# Patient Record
Sex: Male | Born: 1970 | Race: White | Hispanic: No | Marital: Married | Smoking: Never smoker
Health system: Southern US, Community
[De-identification: ages and names within clinical notes are randomized; demographics above are authoritative.]

## PROBLEM LIST (undated history)

## (undated) DIAGNOSIS — M1711 Unilateral primary osteoarthritis, right knee: Secondary | ICD-10-CM

## (undated) DIAGNOSIS — D179 Benign lipomatous neoplasm, unspecified: Secondary | ICD-10-CM

## (undated) DIAGNOSIS — E785 Hyperlipidemia, unspecified: Secondary | ICD-10-CM

## (undated) HISTORY — DX: Hyperlipidemia, unspecified: E78.5

## (undated) HISTORY — DX: Unilateral primary osteoarthritis, right knee: M17.11

## (undated) HISTORY — PX: KNEE BURSECTOMY: SHX5882

## (undated) HISTORY — PX: KNEE ARTHROSCOPY W/ ACL RECONSTRUCTION: SHX1858

## (undated) HISTORY — DX: Benign lipomatous neoplasm, unspecified: D17.9

## (undated) HISTORY — PX: APPENDECTOMY: SHX54

---

## 2002-08-17 ENCOUNTER — Ambulatory Visit (HOSPITAL_BASED_OUTPATIENT_CLINIC_OR_DEPARTMENT_OTHER): Admission: RE | Admit: 2002-08-17 | Discharge: 2002-08-18 | Payer: Self-pay | Admitting: Orthopedic Surgery

## 2003-11-08 ENCOUNTER — Ambulatory Visit (HOSPITAL_BASED_OUTPATIENT_CLINIC_OR_DEPARTMENT_OTHER): Admission: RE | Admit: 2003-11-08 | Discharge: 2003-11-08 | Payer: Self-pay | Admitting: Orthopedic Surgery

## 2013-09-13 ENCOUNTER — Ambulatory Visit (INDEPENDENT_AMBULATORY_CARE_PROVIDER_SITE_OTHER): Payer: BC Managed Care – PPO | Admitting: Surgery

## 2013-09-27 ENCOUNTER — Ambulatory Visit (INDEPENDENT_AMBULATORY_CARE_PROVIDER_SITE_OTHER): Payer: BC Managed Care – PPO | Admitting: Surgery

## 2013-09-27 ENCOUNTER — Encounter (INDEPENDENT_AMBULATORY_CARE_PROVIDER_SITE_OTHER): Payer: Self-pay | Admitting: Surgery

## 2013-09-27 VITALS — BP 108/76 | HR 56 | Temp 98.2°F | Resp 14 | Ht 70.0 in | Wt 188.4 lb

## 2013-09-27 DIAGNOSIS — K429 Umbilical hernia without obstruction or gangrene: Secondary | ICD-10-CM

## 2013-09-27 DIAGNOSIS — R222 Localized swelling, mass and lump, trunk: Secondary | ICD-10-CM

## 2013-09-27 DIAGNOSIS — R229 Localized swelling, mass and lump, unspecified: Secondary | ICD-10-CM

## 2013-09-27 DIAGNOSIS — D171 Benign lipomatous neoplasm of skin and subcutaneous tissue of trunk: Secondary | ICD-10-CM | POA: Insufficient documentation

## 2013-09-27 NOTE — Patient Instructions (Addendum)
You have a mass on your back - most likely a lipoma  Please obtain the MRI report from [facility]  See the Handout(s) we gave you.  Consider surgery.  Please call our office at [phone] if you wish to schedule surgery or if you have further questions / concerns.    Lipoma A lipoma is a noncancerous (benign) tumor composed of fat cells. They are usually found under the skin (subcutaneous). A lipoma may occur in any tissue of the body that contains fat. Common areas for lipomas to appear include the back, shoulders, buttocks, and thighs. Lipomas are a very common soft tissue growth. They are soft and grow slowly. Most problems caused by a lipoma depend on where it is growing. DIAGNOSIS  A lipoma can be diagnosed with a physical exam. These tumors rarely become cancerous, but radiographic studies can help determine this for certain. Studies used may include:  Computerized X-ray scans (CT or CAT scan).  Computerized magnetic scans (MRI). TREATMENT  Small lipomas that are not causing problems may be watched. If a lipoma continues to enlarge or causes problems, removal is often the best treatment. Lipomas can also be removed to improve appearance. Surgery is done to remove the fatty cells and the surrounding capsule. Most often, this is done with medicine that numbs the area (local anesthetic). The removed tissue is examined under a microscope to make sure it is not cancerous. Keep all follow-up appointments with your caregiver. SEEK MEDICAL CARE IF:   The lipoma becomes larger or hard.  The lipoma becomes painful, red, or increasingly swollen. These could be signs of infection or a more serious condition. Document Released: 12/04/2002 Document Revised: 03/07/2012 Document Reviewed: 05/16/2010 [facility] Patient Information 2014 [place], [place].

## 2013-09-27 NOTE — Progress Notes (Signed)
Subjective:     Patient ID: Curtis Lewis, male   DOB: 23-Feb-1971, 42 y.o.   MRN: [id number]  HPI  Cort Dragoo  13-May-1971 [id number]  Patient Care Team: Elvera Lennox as PCP - General (Family Medicine)  This patient is a 42 y.o.male who presents today for surgical evaluation at the request of Elvera Lennox, PA-C.   Reason for visit: Enlarging mass on back.  Pleasant active male.  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  Has had a lump in the middle of his back for several years.  Has slowly gotten larger.  Occasionally bothers him by the end of the day when he is active.  His wife has been concerned.  He recalls getting an MRI two years ago that showed lipoma only.  This was done a tried imaging.  Because it getting larger, he was concerned.  His primary care office agree.  Therefore, the patient was sent to me for evaluation.  No history of fall or trauma.  No history of infection.  History of neurofibromatosis or prior lipomas.  Patient Active Problem List   Diagnosis Date Noted  . Mass on back 09/27/2013    Past Medical History  Diagnosis Date  . Hyperlipidemia   . 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 Osteoarthritis of right knee     Past Surgical History  Procedure Laterality Date  . Appendectomy    . Knee arthroscopy w/ acl reconstruction      x3 on right  . Knee bursectomy      left    History   Social History  . Marital Status: Married    Spouse Name: N/A    Number of Children: N/A  . Years of Education: N/A   Occupational History  . Not on file.   Social History Main Topics  . Smoking status: Never Smoker   . Smokeless tobacco: Not on file  . Alcohol Use: 1.2 oz/week    2 Glasses of wine per week  . Drug Use: No  . Sexual Activity: Not on file   Other Topics Concern  . Not on file   Social History Narrative  . No narrative on file    Family History  Problem Relation Age of Onset  . Cancer Maternal Grandmother     lung    Current Outpatient  Prescriptions  Medication Sig Dispense Refill  . atorvastatin (LIPITOR) 20 MG tablet daily.       No current facility-administered medications for this visit.     Allergies  Allergen Reactions  . Percocet [Oxycodone-Acetaminophen] Itching and Nausea And Vomiting    BP 108/76  Pulse 56  Temp(Src) 98.2 F (36.8 C) (Temporal)  Resp 14  Ht 5\' 10"  (1.778 m)  Wt 188 lb 6.4 oz (85.458 kg)  BMI 27.03 kg/m2  No results found.   Review of Systems  Constitutional: Negative for fever, chills and diaphoresis.  HENT: Negative for nosebleeds, sore throat, facial swelling, mouth sores, trouble swallowing and ear discharge.   Eyes: Negative for photophobia, discharge and visual disturbance.  Respiratory: Negative for choking, chest tightness, shortness of breath and stridor.   Cardiovascular: Negative for chest pain and palpitations.  Gastrointestinal: Negative for nausea, vomiting, abdominal pain, diarrhea, constipation, blood in stool, abdominal distention, anal bleeding and rectal pain.  Endocrine: Negative for cold intolerance and heat intolerance.  Genitourinary: Negative for dysuria, urgency, difficulty urinating and testicular pain.  Musculoskeletal: Negative for myalgias, back pain, arthralgias and gait  problem.  Skin: Negative for color change, pallor, rash and wound.  Allergic/Immunologic: Negative for environmental allergies and food allergies.  Neurological: Negative for dizziness, speech difficulty, weakness, numbness and headaches.  Hematological: Negative for adenopathy. Does not bruise/bleed easily.  Psychiatric/Behavioral: Negative for hallucinations, confusion and agitation.       Objective:   Physical Exam  Constitutional: He is oriented to person, place, and time. He appears well-developed and well-nourished. No distress.  HENT:  Head: Normocephalic.  Mouth/Throat: Oropharynx is clear and moist. No oropharyngeal exudate.  Eyes: Conjunctivae and EOM are normal.  Pupils are equal, round, and reactive to light. No scleral icterus.  Neck: Normal range of motion. Neck supple. No tracheal deviation present.  Cardiovascular: Normal rate, regular rhythm and intact distal pulses.   Pulmonary/Chest: Effort normal and breath sounds normal. No respiratory distress.  Abdominal: Soft. He exhibits no distension. There is no tenderness. A hernia is present. Hernia confirmed positive in the ventral area. Hernia confirmed negative in the right inguinal area and confirmed negative in the left inguinal area.    Musculoskeletal: Normal range of motion. He exhibits no tenderness.  Lymphadenopathy:    He has no cervical adenopathy.       Right: No inguinal adenopathy present.       Left: No inguinal adenopathy present.  Neurological: He is alert and oriented to person, place, and time. No cranial nerve deficit. He exhibits normal muscle tone. Coordination normal.  Skin: Skin is warm and dry. No rash noted. He is not diaphoretic. No erythema. No pallor.  Psychiatric: He has a normal mood and affect. His behavior is normal. Judgment and thought content normal.       Assessment:     Slowly enlarging mass on back.  MRI consistent with lipoma.   Very small and asymptomatic hernia    Plan:     Because it is getting larger and occasionally gets symptomatic, it is reasonable to remove it.  I think it is in the deep subcutaneous layer, therefore I would recommend excision under IV sedation at general anesthesia.  Hopefully it is small enough not to require a drain, but that is a possibility:  The pathophysiology of skin & subcutaneous masses was discussed.  Natural history risks without surgery were discussed.  I recommended surgery to remove the mass.  I explained the technique of removal with use of local anesthesia & possible need for more aggressive sedation/anesthesia for patient comfort.    Risks such as bleeding, infection, heart attack, death, and other risks were  discussed.  I noted a good likelihood this will help address the problem.   Possibility that this will not correct all symptoms was explained. Possibility of regrowth/recurrence of the mass was discussed.  We will work to minimize complications. Questions were answered.  The patient expresses understanding & wishes to proceed with surgery.   Small umbilical hernia asymptomatic.  Would monitor only at this time. If it becomes larger or symptomatic, could repair with suture only

## 2013-10-04 ENCOUNTER — Encounter (INDEPENDENT_AMBULATORY_CARE_PROVIDER_SITE_OTHER): Payer: Self-pay

## 2013-10-16 ENCOUNTER — Encounter (INDEPENDENT_AMBULATORY_CARE_PROVIDER_SITE_OTHER): Payer: Self-pay

## 2014-01-26 ENCOUNTER — Ambulatory Visit (INDEPENDENT_AMBULATORY_CARE_PROVIDER_SITE_OTHER): Payer: BC Managed Care – PPO | Admitting: Surgery

## 2014-01-26 ENCOUNTER — Encounter (INDEPENDENT_AMBULATORY_CARE_PROVIDER_SITE_OTHER): Payer: Self-pay | Admitting: Surgery

## 2014-01-26 VITALS — BP 128/80 | HR 71 | Temp 98.8°F | Resp 18 | Ht 70.0 in | Wt 189.8 lb

## 2014-01-26 DIAGNOSIS — R222 Localized swelling, mass and lump, trunk: Secondary | ICD-10-CM

## 2014-01-26 DIAGNOSIS — R229 Localized swelling, mass and lump, unspecified: Secondary | ICD-10-CM

## 2014-01-26 DIAGNOSIS — K429 Umbilical hernia without obstruction or gangrene: Secondary | ICD-10-CM

## 2014-01-26 NOTE — Progress Notes (Signed)
Subjective:     Patient ID: Curtis Lewis, male   DOB: December 27, 1971, 43 y.o.   MRN: [id number]  HPI   Curtis Lewis  1971/02/02 [id number]  Patient Care Team: Baruch Goldmann as PCP - General (Family Medicine)  This patient is a 43 y.o.male who presents today for surgical evaluation at the request of Baruch Goldmann, PA-C.   Reason for visit: Enlarging mass on back.  Pleasant active male.  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  Has had a lump in the middle of his back for several years.  Has slowly gotten larger.  Occasionally bothers him by the end of the day when he is active.  His wife has been concerned.  He recalls getting an MRI a few years ago that showed lipoma only.  This was done a [facility].  Because it getting larger, he was concerned.  His primary care office agreed.  Therefore, the patient was sent to me for evaluation.  No history of fall or trauma.  No history of infection.  History of neurofibromatosis or prior lipomas.  I saw him 3 months ago.  I recommended surgery.  He claims he called & never heard back.  He was told to come back to the office.  Patient Active Problem List   Diagnosis Date Noted  . Mass on back 09/27/2013  . Umbilical hernia - 30mm asymptomatic 09/27/2013    Past Medical History  Diagnosis Date  . Hyperlipidemia   . 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 Osteoarthritis of right knee     Past Surgical History  Procedure Laterality Date  . Appendectomy    . Knee arthroscopy w/ acl reconstruction      x3 on right  . Knee bursectomy      left    History   Social History  . Marital Status: Married    Spouse Name: N/A    Number of Children: N/A  . Years of Education: N/A   Occupational History  . Not on file.   Social History Main Topics  . Smoking status: Never Smoker   . Smokeless tobacco: Not on file  . Alcohol Use: 1.2 oz/week    2 Glasses of wine per week  . Drug Use: No  . Sexual Activity: Not on file   Other Topics Concern  . Not on  file   Social History Narrative  . No narrative on file    Family History  Problem Relation Age of Onset  . Cancer Maternal Grandmother     lung    Current Outpatient Prescriptions  Medication Sig Dispense Refill  . atorvastatin (LIPITOR) 20 MG tablet daily.       No current facility-administered medications for this visit.     Allergies  Allergen Reactions  . Percocet [Oxycodone-Acetaminophen] Itching and Nausea And Vomiting    BP 128/80  Pulse 71  Temp(Src) 98.8 F (37.1 C) (Temporal)  Resp 18  Ht 5\' 10"  (1.778 m)  Wt 189 lb 12.8 oz (86.093 kg)  BMI 27.23 kg/m2  No results found.   Review of Systems  Constitutional: Negative for fever, chills and diaphoresis.  HENT: Negative for ear discharge, facial swelling, mouth sores, nosebleeds, sore throat and trouble swallowing.   Eyes: Negative for photophobia, discharge and visual disturbance.  Respiratory: Negative for choking, chest tightness, shortness of breath and stridor.   Cardiovascular: Negative for chest pain and palpitations.  Gastrointestinal: Negative for nausea, vomiting, abdominal pain, diarrhea, constipation,  blood in stool, abdominal distention, anal bleeding and rectal pain.  Endocrine: Negative for cold intolerance and heat intolerance.  Genitourinary: Negative for dysuria, urgency, difficulty urinating and testicular pain.  Musculoskeletal: Negative for arthralgias, back pain, gait problem and myalgias.  Skin: Negative for color change, pallor, rash and wound.  Allergic/Immunologic: Negative for environmental allergies and food allergies.  Neurological: Negative for dizziness, speech difficulty, weakness, numbness and headaches.  Hematological: Negative for adenopathy. Does not bruise/bleed easily.  Psychiatric/Behavioral: Negative for hallucinations, confusion and agitation.       Objective:   Physical Exam  Constitutional: He is oriented to person, place, and time. He appears well-developed and  well-nourished. No distress.  HENT:  Head: Normocephalic.  Mouth/Throat: Oropharynx is clear and moist. No oropharyngeal exudate.  Eyes: Conjunctivae and EOM are normal. Pupils are equal, round, and reactive to light. No scleral icterus.  Neck: Normal range of motion. Neck supple. No tracheal deviation present.  Cardiovascular: Normal rate, regular rhythm and intact distal pulses.   Pulmonary/Chest: Effort normal and breath sounds normal. No respiratory distress.  Abdominal: Soft. He exhibits no distension. There is no tenderness. A hernia is present. Hernia confirmed positive in the ventral area. Hernia confirmed negative in the right inguinal area and confirmed negative in the left inguinal area.    Musculoskeletal: Normal range of motion. He exhibits no tenderness.       Arms: Lymphadenopathy:    He has no cervical adenopathy.       Right: No inguinal adenopathy present.       Left: No inguinal adenopathy present.  Neurological: He is alert and oriented to person, place, and time. No cranial nerve deficit. He exhibits normal muscle tone. Coordination normal.  Skin: Skin is warm and dry. No rash noted. He is not diaphoretic. No erythema. No pallor.  Psychiatric: He has a normal mood and affect. His behavior is normal. Judgment and thought content normal.   MRI 10/17/2008 from [facility] reveals 4.5 cm right para-midline lower back mass consistent with lipoma.  I reviewed the films     Assessment:     Slowly enlarging mass on back.  MRI consistent with lipoma.   Very small and asymptomatic hernia    Plan:     Because it is getting larger and occasionally gets symptomatic, it is reasonable to remove it.  I think it is in the deep subcutaneous layer, therefore I would recommend excision under IV sedation at general anesthesia.  Hopefully it is small enough not to require a drain, but that is a possibility:  The pathophysiology of skin & subcutaneous masses was discussed.  Natural  history risks without surgery were discussed.  I recommended surgery to remove the mass.  I explained the technique of removal with use of local anesthesia & possible need for more aggressive sedation/anesthesia for patient comfort.    Risks such as bleeding, infection, heart attack, death, and other risks were discussed.  I noted a good likelihood this will help address the problem.   Possibility that this will not correct all symptoms was explained. Possibility of regrowth/recurrence of the mass was discussed.  We will work to minimize complications. Questions were answered.  The patient expresses understanding & wishes to proceed with surgery.  Small umbilical hernia asymptomatic.  Would monitor only at this time. If it becomes larger or symptomatic, could repair with suture only.  He has had no new symptoms so I think observation is okay.     I apologize  that scheduling have been done yet.  I will give him the benefit of the doubt that there was an issue on our side.   I discussed with scheduling.  They gave me a direct order to tell him to call for help get scheduling done.  No charge on visit.  He expressed appreciation for her due diligence to correct the issue.

## 2014-01-26 NOTE — Patient Instructions (Signed)
Please consider the recommendations that we have given you today:  Consider surgery drew the mass on her back menses lipoma).    You have a very small umbilical hernia at your bellybutton.  If it gets larger or bothers you, consider surgery.  Otherwise hold off.  Please call our office at [phone] & talk to Jessy Oto if you wish to schedule surgery or if you have further questions / concerns.   Lipoma A lipoma is a noncancerous (benign) tumor composed of fat cells. They are usually found under the skin (subcutaneous). A lipoma may occur in any tissue of the body that contains fat. Common areas for lipomas to appear include the back, shoulders, buttocks, and thighs. Lipomas are a very common soft tissue growth. They are soft and grow slowly. Most problems caused by a lipoma depend on where it is growing. DIAGNOSIS  A lipoma can be diagnosed with a physical exam. These tumors rarely become cancerous, but radiographic studies can help determine this for certain. Studies used may include:  Computerized X-ray scans (CT or CAT scan).  Computerized magnetic scans (MRI). TREATMENT  Small lipomas that are not causing problems may be watched. If a lipoma continues to enlarge or causes problems, removal is often the best treatment. Lipomas can also be removed to improve appearance. Surgery is done to remove the fatty cells and the surrounding capsule. Most often, this is done with medicine that numbs the area (local anesthetic). The removed tissue is examined under a microscope to make sure it is not cancerous. Keep all follow-up appointments with your caregiver. SEEK MEDICAL CARE IF:   The lipoma becomes larger or hard.  The lipoma becomes painful, red, or increasingly swollen. These could be signs of infection or a more serious condition. Document Released: 12/04/2002 Document Revised: 03/07/2012 Document Reviewed: 05/16/2010 [facility] Patient Information 2014 [place], [place].  GENERAL  SURGERY: POST OP INSTRUCTIONS  1. DIET: Follow a light bland diet the first 24 hours after arrival home, such as soup, liquids, crackers, etc.  Be sure to include lots of fluids daily.  Avoid fast food or heavy meals as your are more likely to get nauseated.   2. Take your usually prescribed home medications unless otherwise directed. 3. PAIN CONTROL: a. Pain is best controlled by a usual combination of three different methods TOGETHER: i. Ice/Heat ii. Over the counter pain medication iii. Prescription pain medication b. Most patients will experience some swelling and bruising around the incisions.  Ice packs or heating pads (30-60 minutes up to 6 times a day) will help. Use ice for the first few days to help decrease swelling and bruising, then switch to heat to help relax tight/sore spots and speed recovery.  Some people prefer to use ice alone, heat alone, alternating between ice & heat.  Experiment to what works for you.  Swelling and bruising can take several weeks to resolve.   c. It is helpful to take an over-the-counter pain medication regularly for the first few weeks.  Choose one of the following that works best for you: i. Naproxen (Aleve, etc)  Two 220mg  tabs twice a day ii. Ibuprofen (Advil, etc) Three 200mg  tabs four times a day (every meal & bedtime) iii. Acetaminophen (Tylenol, etc) 500-650mg  four times a day (every meal & bedtime) d. A  prescription for pain medication (such as oxycodone, hydrocodone, etc) should be given to you upon discharge.  Take your pain medication as prescribed.  i. If you are having problems/concerns with  the prescription medicine (does not control pain, nausea, vomiting, rash, itching, etc), please call us [phone] to see if we need to switch you to a different pain medicine that will work better for you and/or control your side effect better. ii. If you need a refill on your pain medication, please contact your pharmacy.  They will contact our office  to request authorization. Prescriptions will not be filled after 5 pm or on week-ends. 4. Avoid getting constipated.  Between the surgery and the pain medications, it is common to experience some constipation.  Increasing fluid intake and taking a fiber supplement (such as Metamucil, Citrucel, FiberCon, MiraLax, etc) 1-2 times a day regularly will usually help prevent this problem from occurring.  A mild laxative (prune juice, Milk of Magnesia, MiraLax, etc) should be taken according to package directions if there are no bowel movements after 48 hours.   5. Wash / shower every day.  You may shower over the dressings as they are waterproof.  Continue to shower over incision(s) after the dressing is off. 6. Remove your waterproof bandages 5 days after surgery.  You may leave the incision open to air.  You may have skin tapes (Steri Strips) covering the incision(s).  Leave them on until one week, then remove.  You may replace a dressing/Band-Aid to cover the incision for comfort if you wish.      7. ACTIVITIES as tolerated:   a. You may resume regular (light) daily activities beginning the next day-such as daily self-care, walking, climbing stairs-gradually increasing activities as tolerated.  If you can walk 30 minutes without difficulty, it is safe to try more intense activity such as jogging, treadmill, bicycling, low-impact aerobics, swimming, etc. b. Save the most intensive and strenuous activity for last such as sit-ups, heavy lifting, contact sports, etc  Refrain from any heavy lifting or straining until you are off narcotics for pain control.   c. DO NOT PUSH THROUGH PAIN.  Let pain be your guide: If it hurts to do something, don't do it.  Pain is your body warning you to avoid that activity for another week until the pain goes down. d. You may drive when you are no longer taking prescription pain medication, you can comfortably wear a seatbelt, and you can safely maneuver your car and apply  brakes. e. Dennis Bast may have sexual intercourse when it is comfortable.  8. FOLLOW UP in our office a. Please call [facility] at [phone] to set up an appointment to see your surgeon in the office for a follow-up appointment approximately 2-3 weeks after your surgery. b. Make sure that you call for this appointment the day you arrive home to insure a convenient appointment time. 9. IF YOU HAVE DISABILITY OR FAMILY LEAVE FORMS, BRING THEM TO THE OFFICE FOR PROCESSING.  DO NOT GIVE THEM TO YOUR DOCTOR.   WHEN TO CALL us [phone]: 1. Poor pain control 2. Reactions / problems with new medications (rash/itching, nausea, etc)  3. Fever over 101.5 F (38.5 C) 4. Worsening swelling or bruising 5. Continued bleeding from incision. 6. Increased pain, redness, or drainage from the incision 7. Difficulty breathing / swallowing   The clinic staff is available to answer your questions during regular business hours (8:30am-5pm).  Please don't hesitate to call and ask to speak to one of our nurses for clinical concerns.   If you have a medical emergency, go to the nearest emergency room or call 911.  A surgeon from [employer]  [facility] is always on call at the [facility], [address], [place], [place], [place]  [address] ? MAIN: [phone] ? TOLL FREE: [phone] ?  FAX [phone] [email or web address]

## 2014-02-15 ENCOUNTER — Other Ambulatory Visit (INDEPENDENT_AMBULATORY_CARE_PROVIDER_SITE_OTHER): Payer: Self-pay | Admitting: Surgery

## 2014-02-15 DIAGNOSIS — D1739 Benign lipomatous neoplasm of skin and subcutaneous tissue of other sites: Secondary | ICD-10-CM

## 2014-02-16 ENCOUNTER — Telehealth (INDEPENDENT_AMBULATORY_CARE_PROVIDER_SITE_OTHER): Payer: Self-pay

## 2014-02-16 ENCOUNTER — Other Ambulatory Visit (INDEPENDENT_AMBULATORY_CARE_PROVIDER_SITE_OTHER): Payer: Self-pay | Admitting: *Deleted

## 2014-02-16 MED ORDER — HYDROCODONE-ACETAMINOPHEN 5-325 MG PO TABS: 1.0000 | ORAL_TABLET | ORAL | Status: DC | PRN

## 2014-02-16 NOTE — Telephone Encounter (Signed)
The patient has a drain in the subcutaneous tissues of his back s/p removal of lipomas.  The drain will need to be removed in about 10 days once the output is less than 30 mL a day x 2 days.  That can be a nurse only visit

## 2014-02-16 NOTE — Telephone Encounter (Signed)
Called and spoke to patient to make aware that we will make appointment for drain removal once his drainage has been less than 30 mL's for 2 day's or in 10 day's per Dr. Johney [place] order.  Patient verbalized understanding and will call our office to make appointment for drain removal.

## 2014-02-16 NOTE — Telephone Encounter (Signed)
Patient calling into office to schedule nurse visit for drain removal.  Patient s/p back mass excision on 02/15/14.  Patient aware that we will send a message to Dr. Johney [place] for order for drain removal and call back with appointment.

## 2014-02-20 ENCOUNTER — Telehealth (INDEPENDENT_AMBULATORY_CARE_PROVIDER_SITE_OTHER): Payer: Self-pay

## 2014-02-20 NOTE — Telephone Encounter (Signed)
Notified pt of his pathology report showing benign lipomas per Dr Johney [place]. The pt understands.

## 2014-02-27 ENCOUNTER — Ambulatory Visit (INDEPENDENT_AMBULATORY_CARE_PROVIDER_SITE_OTHER): Payer: BC Managed Care – PPO

## 2014-02-27 DIAGNOSIS — Z4889 Encounter for other specified surgical aftercare: Secondary | ICD-10-CM

## 2014-02-27 DIAGNOSIS — Z4803 Encounter for change or removal of drains: Secondary | ICD-10-CM

## 2014-02-27 NOTE — Progress Notes (Signed)
Patient comes in the office 12 days s/p excision of back mass. He states he has been draining less than 20-25 cc a day for 4 days now. Drain removed per Dr Johney [place] orders. Sutures that were holding the drain in were cut and the drain was removed without difficulty. Applied a dry dressing over drain hole. Advised patient to keep covered for the rest of the day. Tomorrow he can remove bandage and shower. Apply bandage to area as needed for drainage. Patient to follow up with Dr Johney [place] at scheduled date and time.

## 2014-03-01 ENCOUNTER — Ambulatory Visit (INDEPENDENT_AMBULATORY_CARE_PROVIDER_SITE_OTHER): Payer: BC Managed Care – PPO | Admitting: Surgery

## 2014-03-01 ENCOUNTER — Encounter (INDEPENDENT_AMBULATORY_CARE_PROVIDER_SITE_OTHER): Payer: Self-pay | Admitting: Surgery

## 2014-03-01 VITALS — BP 122/80 | HR 78 | Temp 99.2°F | Resp 16 | Ht 70.0 in | Wt 194.0 lb

## 2014-03-01 DIAGNOSIS — D171 Benign lipomatous neoplasm of skin and subcutaneous tissue of trunk: Secondary | ICD-10-CM

## 2014-03-01 DIAGNOSIS — D1779 Benign lipomatous neoplasm of other sites: Secondary | ICD-10-CM

## 2014-03-01 NOTE — Patient Instructions (Signed)
GENERAL SURGERY: POST OP INSTRUCTIONS ° °1. DIET: Follow a light bland diet the first 24 hours after arrival home, such as soup, liquids, crackers, etc.  Be sure to include lots of fluids daily.  Avoid fast food or heavy meals as your are more likely to get nauseated.   °2. Take your usually prescribed home medications unless otherwise directed. °3. PAIN CONTROL: °a. Pain is best controlled by a usual combination of three different methods TOGETHER: °i. Ice/Heat °ii. Over the counter pain medication °iii. Prescription pain medication °b. Most patients will experience some swelling and bruising around the incisions.  Ice packs or heating pads (30-60 minutes up to 6 times a day) will help. Use ice for the first few days to help decrease swelling and bruising, then switch to heat to help relax tight/sore spots and speed recovery.  Some people prefer to use ice alone, heat alone, alternating between ice & heat.  Experiment to what works for you.  Swelling and bruising can take several weeks to resolve.   °c. It is helpful to take an over-the-counter pain medication regularly for the first few weeks.  Choose one of the following that works best for you: °i. Naproxen (Aleve, etc)  Two 220mg tabs twice a day °ii. Ibuprofen (Advil, etc) Three 200mg tabs four times a day (every meal & bedtime) °iii. Acetaminophen (Tylenol, etc) 500-650mg four times a day (every meal & bedtime) °d. A  prescription for pain medication (such as oxycodone, hydrocodone, etc) should be given to you upon discharge.  Take your pain medication as prescribed.  °i. If you are having problems/concerns with the prescription medicine (does not control pain, nausea, vomiting, rash, itching, etc), please call us [phone] to see if we need to switch you to a different pain medicine that will work better for you and/or control your side effect better. °ii. If you need a refill on your pain medication, please contact your pharmacy.  They will contact our  office to request authorization. Prescriptions will not be filled after 5 pm or on week-ends. °4. Avoid getting constipated.  Between the surgery and the pain medications, it is common to experience some constipation.  Increasing fluid intake and taking a fiber supplement (such as Metamucil, Citrucel, FiberCon, MiraLax, etc) 1-2 times a day regularly will usually help prevent this problem from occurring.  A mild laxative (prune juice, Milk of Magnesia, MiraLax, etc) should be taken according to package directions if there are no bowel movements after 48 hours.   °5. Wash / shower every day.  You may shower over the dressings as they are waterproof.  Continue to shower over incision(s) after the dressing is off. °6. Remove your waterproof bandages 5 days after surgery.  You may leave the incision open to air.  You may have skin tapes (Steri Strips) covering the incision(s).  Leave them on until one week, then remove.  You may replace a dressing/Band-Aid to cover the incision for comfort if you wish.  ° ° ° ° °7. ACTIVITIES as tolerated:   °a. You may resume regular (light) daily activities beginning the next day--such as daily self-care, walking, climbing stairs--gradually increasing activities as tolerated.  If you can walk 30 minutes without difficulty, it is safe to try more intense activity such as jogging, treadmill, bicycling, low-impact aerobics, swimming, etc. °b. Save the most intensive and strenuous activity for last such as sit-ups, heavy lifting, contact sports, etc  Refrain from any heavy lifting or straining until you   are off narcotics for pain control.   °c. DO NOT PUSH THROUGH PAIN.  Let pain be your guide: If it hurts to do something, don't do it.  Pain is your body warning you to avoid that activity for another week until the pain goes down. °d. You may drive when you are no longer taking prescription pain medication, you can comfortably wear a seatbelt, and you can safely maneuver your car and  apply brakes. °e. You may have sexual intercourse when it is comfortable.  °8. FOLLOW UP in our office °a. Please call [facility] at [phone] to set up an appointment to see your surgeon in the office for a follow-up appointment approximately 2-3 weeks after your surgery. °b. Make sure that you call for this appointment the day you arrive home to insure a convenient appointment time. °9. IF YOU HAVE DISABILITY OR FAMILY LEAVE FORMS, BRING THEM TO THE OFFICE FOR PROCESSING.  DO NOT GIVE THEM TO YOUR DOCTOR. ° ° °WHEN TO CALL US [phone]: °1. Poor pain control °2. Reactions / problems with new medications (rash/itching, nausea, etc)  °3. Fever over 101.5 F (38.5 C) °4. Worsening swelling or bruising °5. Continued bleeding from incision. °6. Increased pain, redness, or drainage from the incision °7. Difficulty breathing / swallowing ° ° The clinic staff is available to answer your questions during regular business hours (8:30am-5pm).  Please don’t hesitate to call and ask to speak to one of our nurses for clinical concerns.  ° If you have a medical emergency, go to the nearest emergency room or call 911. ° A surgeon from [facility] is always on call at the hospitals ° ° °[facility] °[address], [address], [place], [place]  [address] ? °MAIN: [phone] ? TOLL FREE: [phone] ?  °FAX [phone] °[email or web address] ° °

## 2014-03-02 NOTE — Progress Notes (Signed)
Subjective:     Patient ID: Curtis Lewis, male   DOB: [birth year], 43 y.o.   MRN: [id number]  HPI  Note: This dictation was prepared with Dragon/digital dictation along with [employer]. Any transcriptional errors that result from this process are unintentional.       Hoy Fallert  1971/03/06 [id number]  Patient Care Team: Baruch Goldmann as PCP - General (Family Medicine)  Procedure (Date: 02/15/2014):  Removal of subcutaneous and intramuscular back lipomas  Diagnosis Soft tissue, biopsy, mass, back - MATURE ADIPOSE, CONSISTENT WITH LIPOMA. Vicente Males MD Pathologist, Electronic Signature  This patient returns for surgical re-evaluation.  He feels fine.  Drain came out a few days ago.  He is back to work.  No fevers or chills.  No pain.  In good spirits.  Patient Active Problem List   Diagnosis Date Noted  . Lipoma of back s/p excision 02/15/2014 09/27/2013  . Umbilical hernia - 29mm asymptomatic 09/27/2013    Past Medical History  Diagnosis Date  . Hyperlipidemia   . 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 Osteoarthritis of right knee     Past Surgical History  Procedure Laterality Date  . Appendectomy    . Knee arthroscopy w/ acl reconstruction      x3 on right  . Knee bursectomy      left    History   Social History  . Marital Status: Married    Spouse Name: N/A    Number of Children: N/A  . Years of Education: N/A   Occupational History  . Not on file.   Social History Main Topics  . Smoking status: Never Smoker   . Smokeless tobacco: Not on file  . Alcohol Use: 1.2 oz/week    2 Glasses of wine per week  . Drug Use: No  . Sexual Activity: Not on file   Other Topics Concern  . Not on file   Social History Narrative  . No narrative on file    Family History  Problem Relation Age of Onset  . Cancer Maternal Grandmother     lung    Current Outpatient Prescriptions  Medication Sig Dispense Refill  . atorvastatin (LIPITOR) 20 MG tablet  daily.       No current facility-administered medications for this visit.     Allergies  Allergen Reactions  . Percocet [Oxycodone-Acetaminophen] Itching and Nausea And Vomiting    BP 122/80  Pulse 78  Temp(Src) 99.2 F (37.3 C) (Oral)  Resp 16  Ht 5\' 10"  (1.778 m)  Wt 194 lb (87.998 kg)  BMI 27.84 kg/m2  No results found.   Review of Systems  Constitutional: Negative for fever, chills and diaphoresis.  HENT: Negative for sore throat and trouble swallowing.   Eyes: Negative for photophobia and visual disturbance.  Respiratory: Negative for choking and shortness of breath.   Cardiovascular: Negative for chest pain and palpitations.  Gastrointestinal: Negative for nausea, vomiting, abdominal distention, anal bleeding and rectal pain.  Genitourinary: Negative for dysuria, urgency, difficulty urinating and testicular pain.  Musculoskeletal: Negative for arthralgias, back pain, gait problem, myalgias and neck pain.  Skin: Negative for color change and rash.  Neurological: Negative for dizziness, speech difficulty, weakness and numbness.  Hematological: Negative for adenopathy.  Psychiatric/Behavioral: Negative for hallucinations, confusion and agitation.       Objective:   Physical Exam  Constitutional: He is oriented to person, place, and time. He appears well-developed and well-nourished. No distress.  HENT:  Head: Normocephalic.  Mouth/Throat: Oropharynx is clear and moist. No oropharyngeal exudate.  Eyes: Conjunctivae and EOM are normal. Pupils are equal, round, and reactive to light. No scleral icterus.  Neck: Normal range of motion. No tracheal deviation present.  Cardiovascular: Normal rate, normal heart sounds and intact distal pulses.   Pulmonary/Chest: Effort normal. No respiratory distress.  Abdominal: Soft. He exhibits no distension. There is no tenderness. Hernia confirmed negative in the right inguinal area and confirmed negative in the left inguinal area.    Musculoskeletal: Normal range of motion. He exhibits no tenderness.       Back:  Neurological: He is alert and oriented to person, place, and time. No cranial nerve deficit. He exhibits normal muscle tone. Coordination normal.  Skin: Skin is warm and dry. No rash noted. He is not diaphoretic.  Psychiatric: He has a normal mood and affect. His behavior is normal.       Assessment:     Status post removal of packed subcutaneous and intramuscular lipomas, recovering well     Plan:     I am glad he is healing well so far.  He can put antibiotic ointment on the medial corner.  Otherwise covered with a Band-Aid.  If he has worsening pain swelling or drainage, return to clinic.  I think that is unlikely.  Increase activity as tolerated to regular activity.  Low impact exercise such as walking an hour a day at least ideal.  Do not push through pain.  Diet as tolerated.  Low fat high fiber diet ideal.  Bowel regimen with 30 g fiber a day and fiber supplement as needed to avoid problems.  Return to clinic as needed.   Instructions discussed.  Followup with primary care physician for other health issues as would normally be done.  Consider screening for malignancies (breast, prostate, colon, melanoma, etc) as appropriate.  Questions answered.  The patient expressed understanding and appreciation

## 2014-03-30 ENCOUNTER — Telehealth (INDEPENDENT_AMBULATORY_CARE_PROVIDER_SITE_OTHER): Payer: Self-pay | Admitting: General Surgery

## 2014-03-30 NOTE — Telephone Encounter (Signed)
S/P lipoma excision in Feb.  Pt states wound is draining fluid and this started 2 days ago.  States white drainage from incision as well.  Told patient to have this evaluated in ED or urgent clinic today.  Pt expressed understanding.

## 2014-04-02 ENCOUNTER — Telehealth (INDEPENDENT_AMBULATORY_CARE_PROVIDER_SITE_OTHER): Payer: Self-pay

## 2014-04-02 NOTE — Telephone Encounter (Signed)
[place] know what he should do about the draining wd on his back. Pt states it is clear, urine color. No fever. No redness. Pt advised to cover with dry dsg and he can to to an urgent clinic there if area becomes red, feverish, swollen or fluid changes to appear infected. Pt prefers to come in [place] to have our MD ck wd if drainage continues. Pt advised to avoid pools, ocean or public water and to keep area clean, covered with dry dsg. P states he understands.

## 2014-04-04 ENCOUNTER — Ambulatory Visit (INDEPENDENT_AMBULATORY_CARE_PROVIDER_SITE_OTHER): Payer: BC Managed Care – PPO | Admitting: General Surgery

## 2014-04-04 ENCOUNTER — Encounter (INDEPENDENT_AMBULATORY_CARE_PROVIDER_SITE_OTHER): Payer: Self-pay | Admitting: General Surgery

## 2014-04-04 VITALS — BP 120/80 | HR 72 | Resp 14 | Ht 70.0 in | Wt 192.4 lb

## 2014-04-04 DIAGNOSIS — IMO0002 Reserved for concepts with insufficient information to code with codable children: Secondary | ICD-10-CM

## 2014-04-04 NOTE — Progress Notes (Signed)
Chief complaint: Wound drainage  History: Patient underwent excision of large lipoma from his back about one month ago. He thought he was doing fine but a couple days ago to beat she developed spontaneous drainage of a large amount of clear yellow fluid. He was seen at an urgent care and was given a prescription for clindamycin and cultures were taken. His continued to have persistent drainage. No pain or redness or fever or pus.  Exam BP 120/80  Pulse 72  Resp 14  Ht 5\' 10"  (1.778 m)  Wt 192 lb 6.4 oz (87.272 kg)  BMI 27.61 kg/m2 General: No distress Wounds: There is an incision across the mid back which is opened for about 1 cm medially with a little bit of necrotic fat at the edges and opens into a fairly large cavity with clear serous sanguinous fluid.  Assessment and plan: Spontaneously drained large wound seroma. I evacuated the cavity completely and packed this with dry gauze. He'll remove this tomorrow and begin dressing changes. Complete his antibiotics although I do not see any definite evidence of infection. Return next week for followup.

## 2014-04-08 NOTE — Telephone Encounter (Signed)
Pt needs to be seen by me this week to check on wound from spontaneously draining seroma (s/p back lipoma excision Feb 2015).  Please fit it in.  OK to extend Wed clinic

## 2014-04-09 NOTE — Telephone Encounter (Signed)
LMOM for pt to call me. I want to check on him after pt was seen in urgent office last week by Dr Excell Seltzer and seen out of town.

## 2014-04-10 NOTE — Telephone Encounter (Signed)
Pt returned my call. I asked how the pt was doing and he is doing better. I offered for the pt to be seen this week by Dr Johney [place] so we can check this seroma but the pt declined the appt due to going out of town. The pt is fine with the appt he made for 04/18/14 with Dr Johney [place]. I advised pt that we just wanted to offer an earlier appt to him since Dr Johney [place] was not in the office last week. The pt appreciates the call.

## 2014-04-12 ENCOUNTER — Encounter (INDEPENDENT_AMBULATORY_CARE_PROVIDER_SITE_OTHER): Payer: BC Managed Care – PPO | Admitting: Surgery

## 2014-04-18 ENCOUNTER — Encounter (INDEPENDENT_AMBULATORY_CARE_PROVIDER_SITE_OTHER): Payer: Self-pay | Admitting: Surgery

## 2014-04-18 ENCOUNTER — Ambulatory Visit (INDEPENDENT_AMBULATORY_CARE_PROVIDER_SITE_OTHER): Payer: BC Managed Care – PPO | Admitting: Surgery

## 2014-04-18 VITALS — BP 110/64 | Resp 18 | Ht 70.0 in | Wt 197.0 lb

## 2014-04-18 DIAGNOSIS — D171 Benign lipomatous neoplasm of skin and subcutaneous tissue of trunk: Secondary | ICD-10-CM

## 2014-04-18 DIAGNOSIS — K429 Umbilical hernia without obstruction or gangrene: Secondary | ICD-10-CM

## 2014-04-18 DIAGNOSIS — IMO0002 Reserved for concepts with insufficient information to code with codable children: Secondary | ICD-10-CM

## 2014-04-18 DIAGNOSIS — D1779 Benign lipomatous neoplasm of other sites: Secondary | ICD-10-CM

## 2014-04-18 NOTE — Patient Instructions (Signed)
WOUND CARE  It is important that the wound be kept open.   -Keeping the skin edges apart will allow the wound to gradually heal from the base upwards.   - If the skin edges of the wound close too early, a new fluid pocket can form and infection can occur. -This is the reason to pack deeper wounds with gauze or ribbon -This is why drained wounds cannot be sewed closed right away  A healthy wound should form a lining of bright red "beefy" granulating tissue that will help shrink the wound and help the edges grow new skin into it.   -A little mucus / yellow discharge is normal (the body's natural way to try and form a scab) and should be gently washed off with soap and water with daily dressing changes.  -Green or foul smelling drainage implies bacterial colonization and can slow wound healing - a short course of antibiotic ointment (3-5 days) can help it clear up.  Call the doctor if it does not improve or worsens  -Avoid use of antibiotic ointments for more than a week as they can slow wound healing over time.    -Sometimes other wound care products will be used to reduce need for dressing changes and/or help clean up dirty wounds -Sometimes the surgeon needs to debride the wound in the office to remove dead or infected tissue out of the wound so it can heal more quickly and safely.    Change the dressing at least once a day -Wash the wound with mild soap and water gently every day.  It is good to shower or bathe the wound to help it clean out. -Use clean 4x4 gauze for medium/large wounds or ribbon plain NU-gauze for smaller wounds (it does not need to be sterile, just clean) -Keep the raw wound moist with a little saline or KY (saline) gel on the gauze.  -A dry wound will take longer to heal.  -Keep the skin dry around the wound to prevent breakdown and irritation. -Pack the wound down to the base -The goal is to keep the skin apart, not overpack the wound -Use a Q-tip or blunt-tipped kabob  stick toothpick to push the gauze down to the base in narrow or deep wounds   -Cover with a clean gauze and tape -paper or Medipore tape tend to be gentle on the skin -rotate the orientation of the tape to avoid repeated stress/trauma on the skin -using an ACE or Coban wrap on wounds on arms or legs can be used instead.  Complete all antibiotics through the entire prescription to help the infection heal and prevent new places of infection   Returning the see the surgeon is helpful to follow the healing process and help the wound close as fast as possible.  Seroma A seroma is a collection of fluid that looks like swelling or a mass on the body. Seromas form on the body where tissue has been injured or cut. They are most common after surgeries. Seromas vary in size. Some are small and painless. Others may become large and cause pain or discomfort. Many seromas go away on their own; the fluid is naturally absorbed by the body. Some may require the fluid to be drained through medical procedures.  CAUSES  Seromas form as the result of damage to tissue or the removal of tissue. This tissue damage may occur during surgery or because of an injury or trauma. When tissue is disrupted or removed, empty space is  created. The body's natural defense system causes fluid to enter the empty space and form a seroma. SYMPTOMS   Swelling at the site of a surgical cut (incision) or an injury.  Drainage of clear fluid at the surgery or injury site.  Possible discomfort or pain. DIAGNOSIS  Your caregiver will perform a physical exam. During the exam, the caregiver will press on the seroma using a hand or fingers (palpation). Various tests may be ordered to help confirm the diagnosis. These tests may include:  Blood tests.  Imaging tests such as ultrasonography or computed tomography (CT). TREATMENT  Sometimes seromas resolve on their own and drain naturally in the body. Your caregiver may monitor you to make  sure the seroma does not cause any complications. If your seroma does not resolve on its own, treatment may include:  Using a needle to drain the fluid from the seroma (needle aspiration).  Inserting a flexible tube (catheter) to drain the fluid.  Applying a dressing, such as an elastic bandage or binder.  Use of antibiotic medicines if the seroma becomes infected.  In rare cases, surgery may be done to remove the seroma and repair the area. HOME CARE INSTRUCTIONS  Follow your caregiver's instructions regarding activity levels and any limitations on movements.  Only take over-the-counter or prescription medicines as directed by your caregiver.  If your caregiver prescribes antibiotics, take them as directed. Finish them even if you start to feel better.  Check your seroma every day for redness, warmth, or yellow drainage.  Follow up with your caregiver as directed. SEEK MEDICAL CARE IF:  You develop a fever.  You have pain, tenderness, redness, or warmth at the site of the seroma.  You notice yellow drainage coming from the site of the seroma.  Your seroma is getting bigger. Document Released: 04/10/2013 Document Reviewed: 04/10/2013 [facility] Patient Information 2014 [place], [place].

## 2014-04-18 NOTE — Progress Notes (Signed)
Subjective:     Patient ID: Curtis Lewis, male   DOB: [birth year], 43 y.o.   MRN: [id number]  HPI   Note: This dictation was prepared with Dragon/digital dictation along with [employer]. Any transcriptional errors that result from this process are unintentional.       Curtis Lewis  October 06, 1971 [id number]  Patient Care Team: Baruch Goldmann as PCP - General (Family Medicine)  Procedure (Date: 02/15/2014):  Removal of subcutaneous and intramuscular back lipomas  Diagnosis Soft tissue, biopsy, mass, back - MATURE ADIPOSE, CONSISTENT WITH LIPOMA. Vicente Males MD Pathologist, Electronic Signature  This patient returns for surgical re-evaluation.  Some swelling.  It spontaneously drained.  He saw [place] urgently.  Wound opened up and packed 04/03/2014.  He let the packing come out.  He did not repack it since no one would help him.  He feels like things are mostly dried off.  Occasionally notes some leak of fluid or gas.  Otherwise staying active.  No fevers or chills.  No pain.  In good spirits.  Patient Active Problem List   Diagnosis Date Noted  . Seroma, postoperative 04/18/2014  . Lipoma of back s/p excision 02/15/2014 09/27/2013  . Umbilical hernia - 26mm asymptomatic 09/27/2013    Past Medical History  Diagnosis Date  . Hyperlipidemia   . 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 Osteoarthritis of right knee     Past Surgical History  Procedure Laterality Date  . Appendectomy    . Knee arthroscopy w/ acl reconstruction      x3 on right  . Knee bursectomy      left    History   Social History  . Marital Status: Married    Spouse Name: N/A    Number of Children: N/A  . Years of Education: N/A   Occupational History  . Not on file.   Social History Main Topics  . Smoking status: Never Smoker   . Smokeless tobacco: Not on file  . Alcohol Use: 1.2 oz/week    2 Glasses of wine per week  . Drug Use: No  . Sexual Activity: Not on file   Other Topics Concern  . Not  on file   Social History Narrative  . No narrative on file    Family History  Problem Relation Age of Onset  . Cancer Maternal Grandmother     lung    Current Outpatient Prescriptions  Medication Sig Dispense Refill  . atorvastatin (LIPITOR) 20 MG tablet daily.      . clindamycin (CLEOCIN) 300 MG capsule Take 300 mg by mouth 3 (three) times daily.       No current facility-administered medications for this visit.     Allergies  Allergen Reactions  . Percocet [Oxycodone-Acetaminophen] Itching and Nausea And Vomiting    BP 110/64  Resp 18  Ht 5\' 10"  (1.778 m)  Wt 197 lb (89.359 kg)  BMI 28.27 kg/m2  No results found.   Review of Systems  Constitutional: Negative for fever, chills and diaphoresis.  HENT: Negative for sore throat and trouble swallowing.   Eyes: Negative for photophobia and visual disturbance.  Respiratory: Negative for choking and shortness of breath.   Cardiovascular: Negative for chest pain and palpitations.  Gastrointestinal: Negative for nausea, vomiting, abdominal distention, anal bleeding and rectal pain.  Genitourinary: Negative for dysuria, urgency, difficulty urinating and testicular pain.  Musculoskeletal: Negative for arthralgias, back pain, gait problem, myalgias and neck pain.  Skin:  Negative for color change and rash.  Neurological: Negative for dizziness, speech difficulty, weakness and numbness.  Hematological: Negative for adenopathy.  Psychiatric/Behavioral: Negative for hallucinations, confusion and agitation.       Objective:   Physical Exam  Constitutional: He is oriented to person, place, and time. He appears well-developed and well-nourished. No distress.  HENT:  Head: Normocephalic.  Mouth/Throat: Oropharynx is clear and moist. No oropharyngeal exudate.  Eyes: Conjunctivae and EOM are normal. Pupils are equal, round, and reactive to light. No scleral icterus.  Neck: Normal range of motion. No tracheal deviation present.    Cardiovascular: Normal rate, normal heart sounds and intact distal pulses.   Pulmonary/Chest: Effort normal. No respiratory distress.  Abdominal: Soft. He exhibits no distension. There is no tenderness. Hernia confirmed negative in the right inguinal area and confirmed negative in the left inguinal area.  Musculoskeletal: Normal range of motion. He exhibits no tenderness.       Back:  Neurological: He is alert and oriented to person, place, and time. No cranial nerve deficit. He exhibits normal muscle tone. Coordination normal.  Skin: Skin is warm and dry. No rash noted. He is not diaphoretic.  Psychiatric: He has a normal mood and affect. His behavior is normal.       Assessment:     Status post removal of packed subcutaneous and intramuscular lipomas, recovering well     Plan:     I am sorry that he had a seroma but it seems to be closing down.  I recommend he keep the wick in until this weekend.  Have [place] see him next week to make sure it is closing down.  No need for antibiotics.  Regular activity.  Low impact exercise such as walking an hour a day at least ideal.  Do not push through pain.  Return to clinic q1-2 weeks until healed.   Instructions discussed.  Followup with primary care physician for other health issues as would normally be done.  Consider screening for malignancies (breast, prostate, colon, melanoma, etc) as appropriate.  Questions answered.  The patient expressed understanding and appreciation

## 2014-04-23 ENCOUNTER — Telehealth (INDEPENDENT_AMBULATORY_CARE_PROVIDER_SITE_OTHER): Payer: Self-pay

## 2014-04-23 NOTE — Telephone Encounter (Signed)
Called to check on the pt from last office visit with Dr Johney Maine b/c of the issue with the seroma. The pt stated that the wick fell out on it's own and the area seems to be doing ok with no swelling. I advised pt that I messed up with the pt's appt seeing Dr Johney Maine b/c Dr Johney Maine is not in the office next week due to LDOW. I advised pt that I could make him a nurse visit for this week if he would like to be seen for a wound check but the pt has got a really busy schedule this week. The pt said he is fine with just calling me if he thinks he will need to be checked if not he will plan on seeing [place] 05/09/14. I advised pt to call if any swelling,redness,fever,or foul drainage. The pt understands.

## 2014-05-09 ENCOUNTER — Encounter (INDEPENDENT_AMBULATORY_CARE_PROVIDER_SITE_OTHER): Payer: BC Managed Care – PPO | Admitting: Surgery

## 2016-12-31 DIAGNOSIS — J3089 Other allergic rhinitis: Secondary | ICD-10-CM | POA: Diagnosis not present

## 2016-12-31 DIAGNOSIS — J301 Allergic rhinitis due to pollen: Secondary | ICD-10-CM | POA: Diagnosis not present

## 2017-01-26 ENCOUNTER — Ambulatory Visit (INDEPENDENT_AMBULATORY_CARE_PROVIDER_SITE_OTHER): Payer: 59 | Admitting: Sports Medicine

## 2017-01-26 ENCOUNTER — Encounter: Payer: Self-pay | Admitting: Sports Medicine

## 2017-01-26 DIAGNOSIS — G8929 Other chronic pain: Secondary | ICD-10-CM | POA: Diagnosis not present

## 2017-01-26 DIAGNOSIS — M533 Sacrococcygeal disorders, not elsewhere classified: Secondary | ICD-10-CM | POA: Diagnosis not present

## 2017-01-26 MED ORDER — METHYLPREDNISOLONE ACETATE 40 MG/ML IJ SUSP
40.0000 mg | Freq: Once | INTRAMUSCULAR | Status: AC
  Administered 2017-01-26: 40 mg via INTRA_ARTICULAR

## 2017-01-26 NOTE — Progress Notes (Signed)
  Curtis Lewis - 46 y.o. male MRN [id number]  Date of birth: 1971-04-15  SUBJECTIVE:  Including CC & ROS.   Is a 46 year old male that is presenting with low back pain. He reports that he was shoveling 2 years ago and felt a pop as he was twisting to throw where he had prescription. Since that time he has had left lower sided pain in his lower back. He reports that the pain is kind of a constant dull ache. He feels like it is worse with driving long distances. Over the past 6 months is starting to affect his sleep. He has been trying exercises and stretches at home with no improvement. He takes Advil as needed. He denies a prior history of any similar pain or surgery of his back.  ROS: No unexpected weight loss, fever, chills, swelling, numbness/tingling, redness, otherwise see HPI    HISTORY: Past Medical, Surgical, Social, and Family History Reviewed & Updated per EMR.   Pertinent Historical Findings include: PMSHx -  3 ACL repair on right knee. Left knee bursectomy, lipoma removal  PSHx -  No tobacco use, occasional EtOH use  FHx -  Leg length discrepancy  Medications - Lipitor   DATA REVIEWED: None   PHYSICAL EXAM:  VS: BP:121/76  HR:65bpm  TEMP: ( )  RESP:   HT:5\' 10"  (177.8 cm)   WT:195 lb (88.5 kg)  BMI:28 PHYSICAL EXAM: Gen: NAD, alert, cooperative with exam, well-appearing HEENT: clear conjunctiva, EOMI CV:  no edema, capillary refill brisk,  Resp: non-labored, normal speech Skin: no rashes, normal turgor  Neuro: no gross deficits.  Psych:  alert and oriented Back:   no tenderness to palpation of the lumbar spine. No tenderness to palpation over the paraspinal muscles. Focal area of tenderness overlying the left SI joint. No tenderness to palpation of the greater trochanter bilaterally. Normal back flexion and extension Some weakness with hip abduction bilaterally. Normal hip flexion strength bilaterally Normal internal and external rotation of the hips  bilaterally. Normal knee flexion and extension. Normal strength in lower extremities. Normal plantar and dorsal flexion bilaterally. Negative straight leg raise bilaterally. Normal Faber test. No pain with cross leg back flexion. Appears to be more tight on the left with pelvic rocking   Aspiration/Injection Procedure Note Giomar Rohs 1971/06/16  Procedure: Injection  Indications: SI joint pain   Procedure Details Consent: Risks of procedure as well as the alternatives and risks of each were explained to the (patient/caregiver).  Consent for procedure obtained. Time Out: Verified patient identification, verified procedure, site/side was marked, verified correct patient position, special equipment/implants available, medications/allergies/relevent history reviewed, required imaging and test results available.  Performed.  The area was cleaned with iodine and alcohol swabs.    The left SI joint was injected using 1 cc's of 40 mg Depomedrol and 3 cc's of 1% lidocaine with a 25 1 1/2" needle.     A sterile dressing was applied.  Patient did tolerate procedure well.   ASSESSMENT & PLAN:   Chronic left SI joint pain It appears that his left SI joint is the source of his pain. He hasn't had any improvement over the past 2 years. Has tried home stretches and exercise with no improvement. - Left SI joint injection performed today. - provided HEP  - encouraged to f/u in 4 weeks if no improvement

## 2017-01-27 DIAGNOSIS — M533 Sacrococcygeal disorders, not elsewhere classified: Principal | ICD-10-CM

## 2017-01-27 DIAGNOSIS — G8929 Other chronic pain: Secondary | ICD-10-CM | POA: Insufficient documentation

## 2017-01-27 NOTE — Assessment & Plan Note (Signed)
It appears that his left SI joint is the source of his pain. He hasn't had any improvement over the past 2 years. Has tried home stretches and exercise with no improvement. - Left SI joint injection performed today. - provided HEP  - encouraged to f/u in 4 weeks if no improvement

## 2017-02-03 DIAGNOSIS — J301 Allergic rhinitis due to pollen: Secondary | ICD-10-CM | POA: Diagnosis not present

## 2017-02-03 DIAGNOSIS — J3089 Other allergic rhinitis: Secondary | ICD-10-CM | POA: Diagnosis not present

## 2017-02-12 DIAGNOSIS — J3089 Other allergic rhinitis: Secondary | ICD-10-CM | POA: Diagnosis not present

## 2017-02-12 DIAGNOSIS — J301 Allergic rhinitis due to pollen: Secondary | ICD-10-CM | POA: Diagnosis not present

## 2017-02-25 DIAGNOSIS — J3089 Other allergic rhinitis: Secondary | ICD-10-CM | POA: Diagnosis not present

## 2017-02-25 DIAGNOSIS — J301 Allergic rhinitis due to pollen: Secondary | ICD-10-CM | POA: Diagnosis not present

## 2017-03-02 DIAGNOSIS — J301 Allergic rhinitis due to pollen: Secondary | ICD-10-CM | POA: Diagnosis not present

## 2017-03-02 DIAGNOSIS — J3089 Other allergic rhinitis: Secondary | ICD-10-CM | POA: Diagnosis not present

## 2017-03-05 DIAGNOSIS — J3089 Other allergic rhinitis: Secondary | ICD-10-CM | POA: Diagnosis not present

## 2017-03-05 DIAGNOSIS — J301 Allergic rhinitis due to pollen: Secondary | ICD-10-CM | POA: Diagnosis not present

## 2017-03-08 DIAGNOSIS — J3089 Other allergic rhinitis: Secondary | ICD-10-CM | POA: Diagnosis not present

## 2017-03-08 DIAGNOSIS — J301 Allergic rhinitis due to pollen: Secondary | ICD-10-CM | POA: Diagnosis not present

## 2017-03-12 DIAGNOSIS — J3089 Other allergic rhinitis: Secondary | ICD-10-CM | POA: Diagnosis not present

## 2017-03-12 DIAGNOSIS — J301 Allergic rhinitis due to pollen: Secondary | ICD-10-CM | POA: Diagnosis not present

## 2017-03-15 DIAGNOSIS — J3089 Other allergic rhinitis: Secondary | ICD-10-CM | POA: Diagnosis not present

## 2017-03-15 DIAGNOSIS — J301 Allergic rhinitis due to pollen: Secondary | ICD-10-CM | POA: Diagnosis not present

## 2017-03-29 DIAGNOSIS — J3089 Other allergic rhinitis: Secondary | ICD-10-CM | POA: Diagnosis not present

## 2017-03-29 DIAGNOSIS — J301 Allergic rhinitis due to pollen: Secondary | ICD-10-CM | POA: Diagnosis not present

## 2017-05-03 DIAGNOSIS — J301 Allergic rhinitis due to pollen: Secondary | ICD-10-CM | POA: Diagnosis not present

## 2017-05-03 DIAGNOSIS — J3089 Other allergic rhinitis: Secondary | ICD-10-CM | POA: Diagnosis not present

## 2017-05-28 DIAGNOSIS — J301 Allergic rhinitis due to pollen: Secondary | ICD-10-CM | POA: Diagnosis not present

## 2017-05-28 DIAGNOSIS — J3089 Other allergic rhinitis: Secondary | ICD-10-CM | POA: Diagnosis not present

## 2017-07-05 DIAGNOSIS — J3089 Other allergic rhinitis: Secondary | ICD-10-CM | POA: Diagnosis not present

## 2017-07-05 DIAGNOSIS — J301 Allergic rhinitis due to pollen: Secondary | ICD-10-CM | POA: Diagnosis not present

## 2017-07-22 DIAGNOSIS — J301 Allergic rhinitis due to pollen: Secondary | ICD-10-CM | POA: Diagnosis not present

## 2017-07-22 DIAGNOSIS — J3089 Other allergic rhinitis: Secondary | ICD-10-CM | POA: Diagnosis not present

## 2017-07-28 DIAGNOSIS — M545 Low back pain: Secondary | ICD-10-CM | POA: Diagnosis not present

## 2017-09-01 DIAGNOSIS — J301 Allergic rhinitis due to pollen: Secondary | ICD-10-CM | POA: Diagnosis not present

## 2017-09-01 DIAGNOSIS — J3089 Other allergic rhinitis: Secondary | ICD-10-CM | POA: Diagnosis not present

## 2017-10-01 DIAGNOSIS — J3089 Other allergic rhinitis: Secondary | ICD-10-CM | POA: Diagnosis not present

## 2017-10-01 DIAGNOSIS — J301 Allergic rhinitis due to pollen: Secondary | ICD-10-CM | POA: Diagnosis not present

## 2017-11-01 DIAGNOSIS — J3089 Other allergic rhinitis: Secondary | ICD-10-CM | POA: Diagnosis not present

## 2017-11-01 DIAGNOSIS — J301 Allergic rhinitis due to pollen: Secondary | ICD-10-CM | POA: Diagnosis not present

## 2017-11-26 DIAGNOSIS — J301 Allergic rhinitis due to pollen: Secondary | ICD-10-CM | POA: Diagnosis not present

## 2017-11-26 DIAGNOSIS — J3089 Other allergic rhinitis: Secondary | ICD-10-CM | POA: Diagnosis not present

## 2017-12-17 DIAGNOSIS — J301 Allergic rhinitis due to pollen: Secondary | ICD-10-CM | POA: Diagnosis not present

## 2017-12-17 DIAGNOSIS — J3089 Other allergic rhinitis: Secondary | ICD-10-CM | POA: Diagnosis not present

## 2018-01-06 DIAGNOSIS — L0211 Cutaneous abscess of neck: Secondary | ICD-10-CM | POA: Diagnosis not present

## 2018-01-20 DIAGNOSIS — J3089 Other allergic rhinitis: Secondary | ICD-10-CM | POA: Diagnosis not present

## 2018-01-20 DIAGNOSIS — J301 Allergic rhinitis due to pollen: Secondary | ICD-10-CM | POA: Diagnosis not present

## 2018-02-09 DIAGNOSIS — J301 Allergic rhinitis due to pollen: Secondary | ICD-10-CM | POA: Diagnosis not present

## 2018-02-09 DIAGNOSIS — J3089 Other allergic rhinitis: Secondary | ICD-10-CM | POA: Diagnosis not present

## 2018-02-25 DIAGNOSIS — J301 Allergic rhinitis due to pollen: Secondary | ICD-10-CM | POA: Diagnosis not present

## 2018-02-25 DIAGNOSIS — J3089 Other allergic rhinitis: Secondary | ICD-10-CM | POA: Diagnosis not present

## 2018-02-28 DIAGNOSIS — M533 Sacrococcygeal disorders, not elsewhere classified: Secondary | ICD-10-CM | POA: Diagnosis not present

## 2018-02-28 DIAGNOSIS — M545 Low back pain: Secondary | ICD-10-CM | POA: Diagnosis not present

## 2018-03-22 DIAGNOSIS — E785 Hyperlipidemia, unspecified: Secondary | ICD-10-CM | POA: Diagnosis not present

## 2018-03-25 DIAGNOSIS — J3089 Other allergic rhinitis: Secondary | ICD-10-CM | POA: Diagnosis not present

## 2018-03-25 DIAGNOSIS — J301 Allergic rhinitis due to pollen: Secondary | ICD-10-CM | POA: Diagnosis not present

## 2018-04-11 DIAGNOSIS — M545 Low back pain: Secondary | ICD-10-CM | POA: Diagnosis not present

## 2018-04-11 DIAGNOSIS — M5416 Radiculopathy, lumbar region: Secondary | ICD-10-CM | POA: Diagnosis not present

## 2018-04-12 DIAGNOSIS — M545 Low back pain: Secondary | ICD-10-CM | POA: Diagnosis not present

## 2018-04-25 DIAGNOSIS — M545 Low back pain: Secondary | ICD-10-CM | POA: Diagnosis not present

## 2018-04-25 DIAGNOSIS — M5416 Radiculopathy, lumbar region: Secondary | ICD-10-CM | POA: Diagnosis not present

## 2018-04-25 DIAGNOSIS — M47816 Spondylosis without myelopathy or radiculopathy, lumbar region: Secondary | ICD-10-CM | POA: Diagnosis not present

## 2018-04-28 DIAGNOSIS — J301 Allergic rhinitis due to pollen: Secondary | ICD-10-CM | POA: Diagnosis not present

## 2018-04-28 DIAGNOSIS — J3089 Other allergic rhinitis: Secondary | ICD-10-CM | POA: Diagnosis not present

## 2018-05-04 DIAGNOSIS — J301 Allergic rhinitis due to pollen: Secondary | ICD-10-CM | POA: Diagnosis not present

## 2018-05-04 DIAGNOSIS — J3089 Other allergic rhinitis: Secondary | ICD-10-CM | POA: Diagnosis not present

## 2018-05-06 DIAGNOSIS — J301 Allergic rhinitis due to pollen: Secondary | ICD-10-CM | POA: Diagnosis not present

## 2018-05-10 DIAGNOSIS — J3089 Other allergic rhinitis: Secondary | ICD-10-CM | POA: Diagnosis not present

## 2018-05-10 DIAGNOSIS — J301 Allergic rhinitis due to pollen: Secondary | ICD-10-CM | POA: Diagnosis not present

## 2018-05-12 DIAGNOSIS — J301 Allergic rhinitis due to pollen: Secondary | ICD-10-CM | POA: Diagnosis not present

## 2018-05-12 DIAGNOSIS — J3089 Other allergic rhinitis: Secondary | ICD-10-CM | POA: Diagnosis not present

## 2018-05-13 DIAGNOSIS — M545 Low back pain: Secondary | ICD-10-CM | POA: Diagnosis not present

## 2018-05-13 DIAGNOSIS — M5416 Radiculopathy, lumbar region: Secondary | ICD-10-CM | POA: Diagnosis not present

## 2018-05-13 DIAGNOSIS — M47816 Spondylosis without myelopathy or radiculopathy, lumbar region: Secondary | ICD-10-CM | POA: Diagnosis not present

## 2018-05-26 DIAGNOSIS — M47816 Spondylosis without myelopathy or radiculopathy, lumbar region: Secondary | ICD-10-CM | POA: Diagnosis not present

## 2018-05-26 DIAGNOSIS — M5416 Radiculopathy, lumbar region: Secondary | ICD-10-CM | POA: Diagnosis not present

## 2018-05-26 DIAGNOSIS — M545 Low back pain: Secondary | ICD-10-CM | POA: Diagnosis not present

## 2018-05-30 DIAGNOSIS — J301 Allergic rhinitis due to pollen: Secondary | ICD-10-CM | POA: Diagnosis not present

## 2018-05-30 DIAGNOSIS — J3089 Other allergic rhinitis: Secondary | ICD-10-CM | POA: Diagnosis not present

## 2018-06-07 DIAGNOSIS — H518 Other specified disorders of binocular movement: Secondary | ICD-10-CM | POA: Diagnosis not present

## 2018-06-27 DIAGNOSIS — J301 Allergic rhinitis due to pollen: Secondary | ICD-10-CM | POA: Diagnosis not present

## 2018-06-27 DIAGNOSIS — J3089 Other allergic rhinitis: Secondary | ICD-10-CM | POA: Diagnosis not present

## 2018-07-13 DIAGNOSIS — M545 Low back pain: Secondary | ICD-10-CM | POA: Diagnosis not present

## 2018-07-13 DIAGNOSIS — M5416 Radiculopathy, lumbar region: Secondary | ICD-10-CM | POA: Diagnosis not present

## 2018-07-28 DIAGNOSIS — J301 Allergic rhinitis due to pollen: Secondary | ICD-10-CM | POA: Diagnosis not present

## 2018-07-28 DIAGNOSIS — J3089 Other allergic rhinitis: Secondary | ICD-10-CM | POA: Diagnosis not present

## 2018-08-31 DIAGNOSIS — J3089 Other allergic rhinitis: Secondary | ICD-10-CM | POA: Diagnosis not present

## 2018-08-31 DIAGNOSIS — J301 Allergic rhinitis due to pollen: Secondary | ICD-10-CM | POA: Diagnosis not present

## 2018-09-26 DIAGNOSIS — J301 Allergic rhinitis due to pollen: Secondary | ICD-10-CM | POA: Diagnosis not present

## 2018-09-26 DIAGNOSIS — J3089 Other allergic rhinitis: Secondary | ICD-10-CM | POA: Diagnosis not present

## 2018-10-26 DIAGNOSIS — J3089 Other allergic rhinitis: Secondary | ICD-10-CM | POA: Diagnosis not present

## 2018-10-26 DIAGNOSIS — J301 Allergic rhinitis due to pollen: Secondary | ICD-10-CM | POA: Diagnosis not present

## 2018-11-29 DIAGNOSIS — J301 Allergic rhinitis due to pollen: Secondary | ICD-10-CM | POA: Diagnosis not present

## 2018-11-29 DIAGNOSIS — J3089 Other allergic rhinitis: Secondary | ICD-10-CM | POA: Diagnosis not present

## 2018-11-30 NOTE — Progress Notes (Signed)
Mr. Curtis Lewis received his flu shot on 12/3 at the [facility] to his LT deltoid by the undersigned. Lot#[id number] NDC: 82429-980-69 NPM:[id number] Exp: 06/27/19

## 2018-12-26 DIAGNOSIS — J301 Allergic rhinitis due to pollen: Secondary | ICD-10-CM | POA: Diagnosis not present

## 2018-12-26 DIAGNOSIS — J3089 Other allergic rhinitis: Secondary | ICD-10-CM | POA: Diagnosis not present

## 2019-01-18 DIAGNOSIS — M5416 Radiculopathy, lumbar region: Secondary | ICD-10-CM | POA: Diagnosis not present

## 2019-01-18 DIAGNOSIS — M545 Low back pain: Secondary | ICD-10-CM | POA: Diagnosis not present

## 2019-01-23 DIAGNOSIS — M533 Sacrococcygeal disorders, not elsewhere classified: Secondary | ICD-10-CM | POA: Diagnosis not present

## 2019-01-23 DIAGNOSIS — M545 Low back pain: Secondary | ICD-10-CM | POA: Diagnosis not present

## 2019-01-25 ENCOUNTER — Other Ambulatory Visit: Payer: Self-pay | Admitting: Orthopedic Surgery

## 2019-01-25 DIAGNOSIS — M533 Sacrococcygeal disorders, not elsewhere classified: Secondary | ICD-10-CM

## 2019-01-31 DIAGNOSIS — J3089 Other allergic rhinitis: Secondary | ICD-10-CM | POA: Diagnosis not present

## 2019-01-31 DIAGNOSIS — J301 Allergic rhinitis due to pollen: Secondary | ICD-10-CM | POA: Diagnosis not present

## 2019-02-01 DIAGNOSIS — E785 Hyperlipidemia, unspecified: Secondary | ICD-10-CM | POA: Diagnosis not present

## 2019-02-02 ENCOUNTER — Ambulatory Visit
Admission: RE | Admit: 2019-02-02 | Discharge: 2019-02-02 | Disposition: A | Discharge status: Home / Self Care | Payer: 59 | Source: Ambulatory Visit | Attending: Orthopedic Surgery | Admitting: Orthopedic Surgery

## 2019-02-02 DIAGNOSIS — M533 Sacrococcygeal disorders, not elsewhere classified: Secondary | ICD-10-CM

## 2019-02-09 DIAGNOSIS — J3089 Other allergic rhinitis: Secondary | ICD-10-CM | POA: Diagnosis not present

## 2019-02-09 DIAGNOSIS — J301 Allergic rhinitis due to pollen: Secondary | ICD-10-CM | POA: Diagnosis not present

## 2019-02-13 DIAGNOSIS — M545 Low back pain: Secondary | ICD-10-CM | POA: Diagnosis not present

## 2019-02-23 DIAGNOSIS — M461 Sacroiliitis, not elsewhere classified: Secondary | ICD-10-CM | POA: Diagnosis not present

## 2019-02-23 DIAGNOSIS — M545 Low back pain: Secondary | ICD-10-CM | POA: Diagnosis not present

## 2019-03-02 DIAGNOSIS — M461 Sacroiliitis, not elsewhere classified: Secondary | ICD-10-CM | POA: Diagnosis not present

## 2019-03-02 DIAGNOSIS — M545 Low back pain: Secondary | ICD-10-CM | POA: Diagnosis not present

## 2019-03-03 DIAGNOSIS — J301 Allergic rhinitis due to pollen: Secondary | ICD-10-CM | POA: Diagnosis not present

## 2019-03-03 DIAGNOSIS — J3089 Other allergic rhinitis: Secondary | ICD-10-CM | POA: Diagnosis not present

## 2019-03-09 DIAGNOSIS — M461 Sacroiliitis, not elsewhere classified: Secondary | ICD-10-CM | POA: Diagnosis not present

## 2019-03-09 DIAGNOSIS — M545 Low back pain: Secondary | ICD-10-CM | POA: Diagnosis not present

## 2019-03-20 DIAGNOSIS — M5412 Radiculopathy, cervical region: Secondary | ICD-10-CM | POA: Diagnosis not present

## 2019-03-24 DIAGNOSIS — M542 Cervicalgia: Secondary | ICD-10-CM | POA: Diagnosis not present

## 2019-03-27 DIAGNOSIS — J3089 Other allergic rhinitis: Secondary | ICD-10-CM | POA: Diagnosis not present

## 2019-03-27 DIAGNOSIS — J301 Allergic rhinitis due to pollen: Secondary | ICD-10-CM | POA: Diagnosis not present

## 2019-04-03 DIAGNOSIS — M5412 Radiculopathy, cervical region: Secondary | ICD-10-CM | POA: Diagnosis not present

## 2019-04-20 DIAGNOSIS — M5412 Radiculopathy, cervical region: Secondary | ICD-10-CM | POA: Diagnosis not present

## 2019-04-20 DIAGNOSIS — M542 Cervicalgia: Secondary | ICD-10-CM | POA: Diagnosis not present

## 2019-04-26 DIAGNOSIS — J3089 Other allergic rhinitis: Secondary | ICD-10-CM | POA: Diagnosis not present

## 2019-04-26 DIAGNOSIS — J301 Allergic rhinitis due to pollen: Secondary | ICD-10-CM | POA: Diagnosis not present

## 2019-05-09 DIAGNOSIS — J3089 Other allergic rhinitis: Secondary | ICD-10-CM | POA: Diagnosis not present

## 2019-05-09 DIAGNOSIS — J301 Allergic rhinitis due to pollen: Secondary | ICD-10-CM | POA: Diagnosis not present

## 2020-03-19 ENCOUNTER — Other Ambulatory Visit: Payer: Self-pay | Admitting: Orthopedic Surgery

## 2020-03-19 DIAGNOSIS — M533 Sacrococcygeal disorders, not elsewhere classified: Secondary | ICD-10-CM

## 2020-03-26 ENCOUNTER — Ambulatory Visit
Admission: RE | Admit: 2020-03-26 | Discharge: 2020-03-26 | Disposition: A | Discharge status: Home / Self Care | Payer: 59 | Source: Ambulatory Visit | Attending: Orthopedic Surgery | Admitting: Orthopedic Surgery

## 2020-03-26 ENCOUNTER — Other Ambulatory Visit: Payer: Self-pay

## 2020-03-26 DIAGNOSIS — M533 Sacrococcygeal disorders, not elsewhere classified: Secondary | ICD-10-CM

## 2020-04-17 ENCOUNTER — Other Ambulatory Visit: Payer: Self-pay | Admitting: Orthopedic Surgery

## 2020-04-17 DIAGNOSIS — M533 Sacrococcygeal disorders, not elsewhere classified: Secondary | ICD-10-CM

## 2020-04-30 ENCOUNTER — Ambulatory Visit
Admission: RE | Admit: 2020-04-30 | Discharge: 2020-04-30 | Disposition: A | Discharge status: Home / Self Care | Payer: 59 | Source: Ambulatory Visit | Attending: Orthopedic Surgery | Admitting: Orthopedic Surgery

## 2020-04-30 ENCOUNTER — Other Ambulatory Visit: Payer: Self-pay

## 2020-04-30 DIAGNOSIS — M533 Sacrococcygeal disorders, not elsewhere classified: Secondary | ICD-10-CM

## 2020-04-30 MED ORDER — METHYLPREDNISOLONE ACETATE 40 MG/ML INJ SUSP (RADIOLOG
120.0000 mg | Freq: Once | INTRAMUSCULAR | Status: AC
  Administered 2020-04-30: 120 mg via INTRA_ARTICULAR

## 2021-01-09 DIAGNOSIS — M5412 Radiculopathy, cervical region: Secondary | ICD-10-CM | POA: Diagnosis not present

## 2021-01-23 DIAGNOSIS — M5412 Radiculopathy, cervical region: Secondary | ICD-10-CM | POA: Diagnosis not present

## 2021-02-04 DIAGNOSIS — M5412 Radiculopathy, cervical region: Secondary | ICD-10-CM | POA: Diagnosis not present

## 2021-02-27 DIAGNOSIS — M5412 Radiculopathy, cervical region: Secondary | ICD-10-CM | POA: Diagnosis not present

## 2021-03-10 DIAGNOSIS — M5412 Radiculopathy, cervical region: Secondary | ICD-10-CM | POA: Diagnosis not present

## 2021-03-14 DIAGNOSIS — Z1211 Encounter for screening for malignant neoplasm of colon: Secondary | ICD-10-CM | POA: Diagnosis not present

## 2021-03-14 DIAGNOSIS — M542 Cervicalgia: Secondary | ICD-10-CM | POA: Diagnosis not present

## 2021-03-14 DIAGNOSIS — E785 Hyperlipidemia, unspecified: Secondary | ICD-10-CM | POA: Diagnosis not present

## 2021-03-25 DIAGNOSIS — M5412 Radiculopathy, cervical region: Secondary | ICD-10-CM | POA: Diagnosis not present

## 2021-04-10 DIAGNOSIS — M5412 Radiculopathy, cervical region: Secondary | ICD-10-CM | POA: Diagnosis not present

## 2021-08-11 DIAGNOSIS — J301 Allergic rhinitis due to pollen: Secondary | ICD-10-CM | POA: Diagnosis not present

## 2021-08-11 DIAGNOSIS — J3089 Other allergic rhinitis: Secondary | ICD-10-CM | POA: Diagnosis not present

## 2021-08-13 DIAGNOSIS — J3089 Other allergic rhinitis: Secondary | ICD-10-CM | POA: Diagnosis not present

## 2021-08-13 DIAGNOSIS — J301 Allergic rhinitis due to pollen: Secondary | ICD-10-CM | POA: Diagnosis not present

## 2021-08-18 DIAGNOSIS — J3089 Other allergic rhinitis: Secondary | ICD-10-CM | POA: Diagnosis not present

## 2021-08-18 DIAGNOSIS — J301 Allergic rhinitis due to pollen: Secondary | ICD-10-CM | POA: Diagnosis not present

## 2021-08-20 DIAGNOSIS — J301 Allergic rhinitis due to pollen: Secondary | ICD-10-CM | POA: Diagnosis not present

## 2021-08-20 DIAGNOSIS — J3089 Other allergic rhinitis: Secondary | ICD-10-CM | POA: Diagnosis not present

## 2021-08-25 DIAGNOSIS — J301 Allergic rhinitis due to pollen: Secondary | ICD-10-CM | POA: Diagnosis not present

## 2021-08-25 DIAGNOSIS — J3089 Other allergic rhinitis: Secondary | ICD-10-CM | POA: Diagnosis not present

## 2021-09-02 IMAGING — CT CT BIOPSY
4 of 7 series · 12 of 32 positions shown, 17 images · non-contrast
Comparison: none

CLINICAL DATA: Left sacroiliac pain. Positive response to a
previous anesthetic only SI injection.

[Series 2: needle -guided injection · axial · 0.79mm/px · z∈[-388,-318]mm · 6 of 50 slices shown, 11 images (1 of 4)]
[im 8/50  soft-tissue]
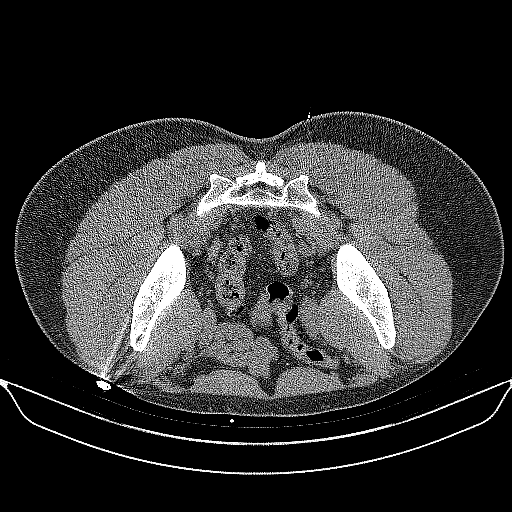
[im 8/50  bone]
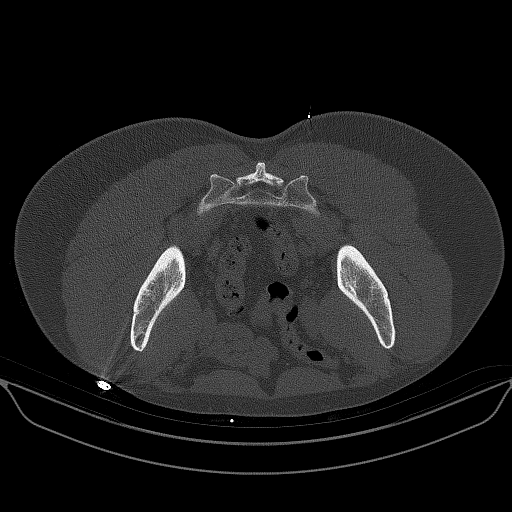
[im 15/50  soft-tissue]
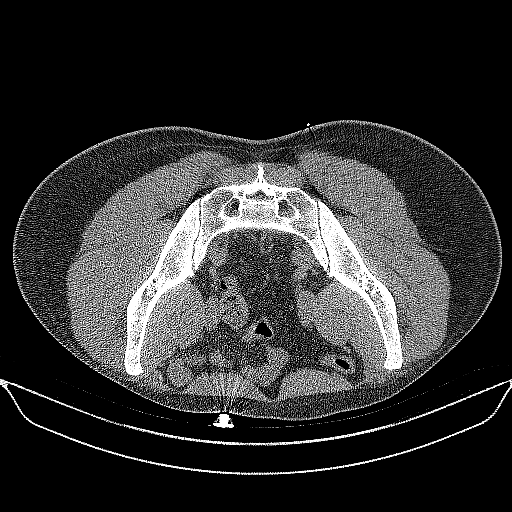
[im 22/50  soft-tissue]
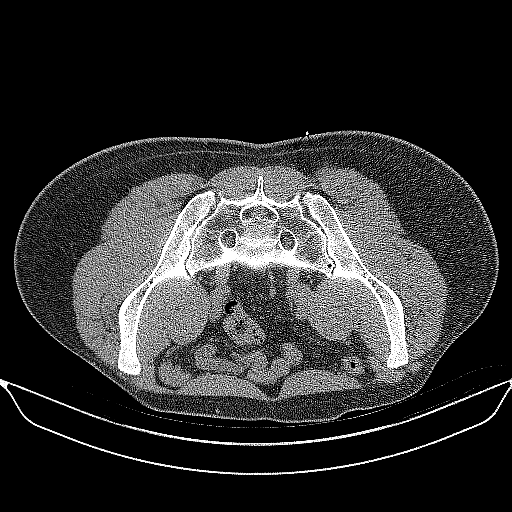
[im 22/50  lung]
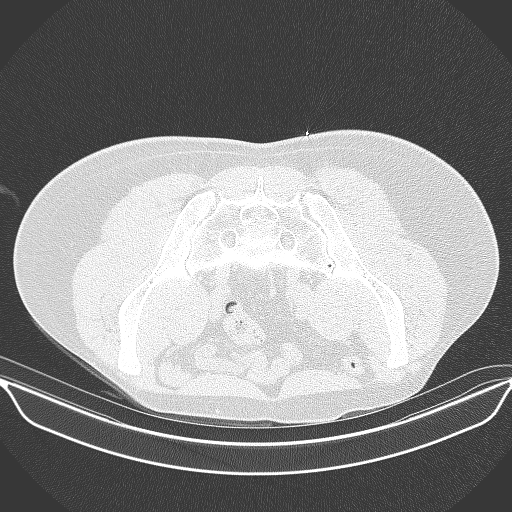
[im 29/50  soft-tissue]
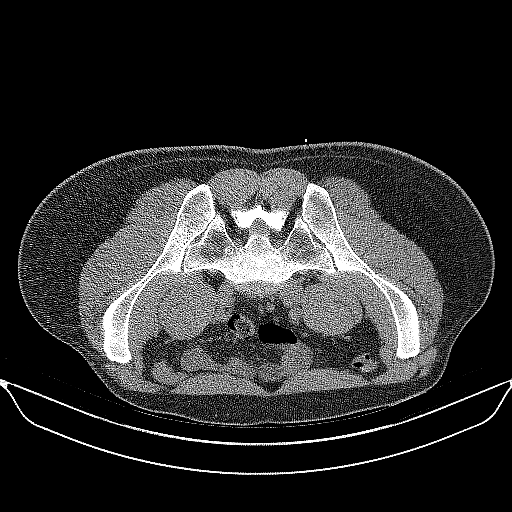
[im 29/50  lung]
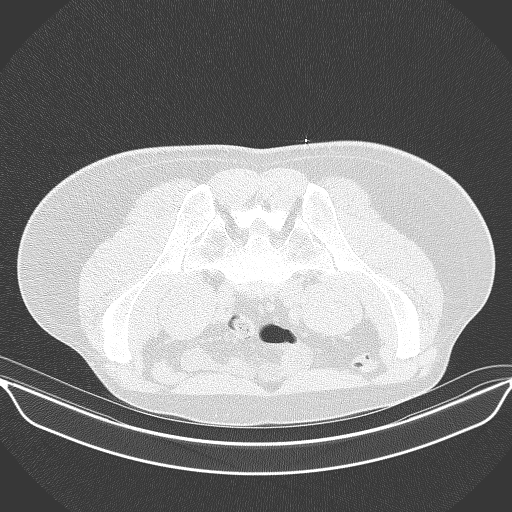
[im 36/50  soft-tissue]
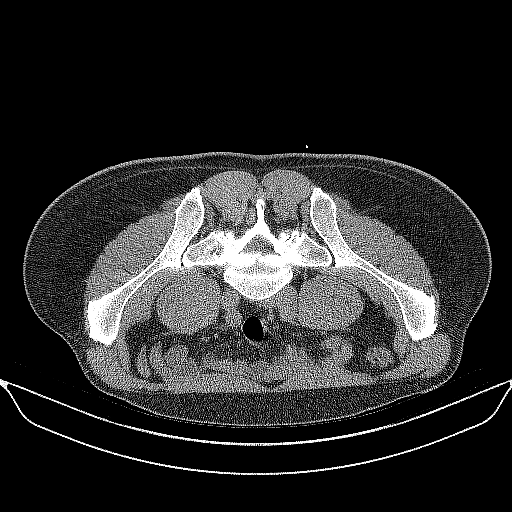
[im 36/50  lung]
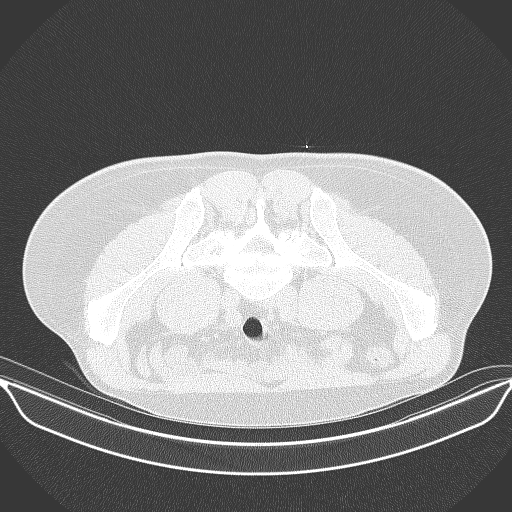
[im 43/50  soft-tissue]
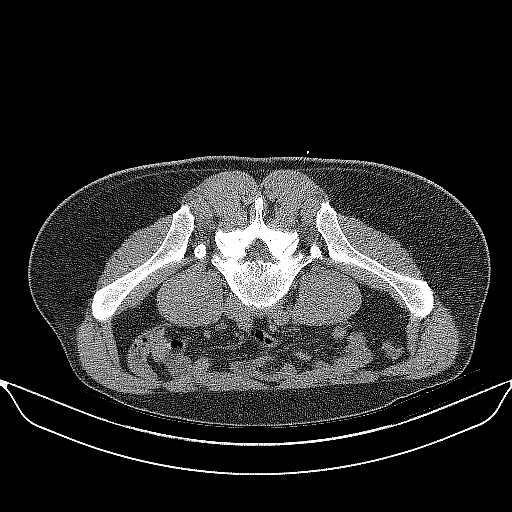
[im 43/50  lung]
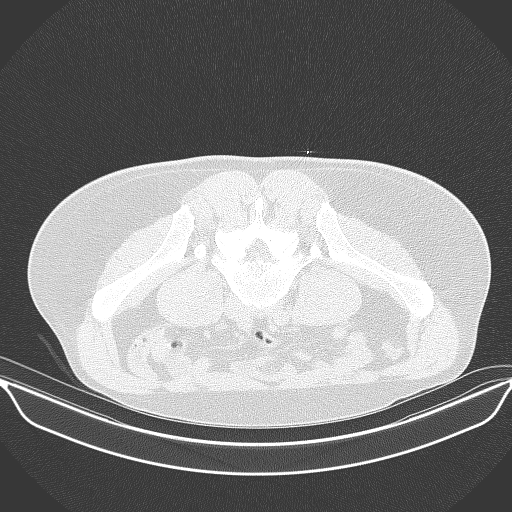

[Series 3: needle -guided injection · axial · 0.79mm/px · z∈[-385,-367]mm · 2 of 28 slices shown (2 of 4)]
[im 10/28  soft-tissue]
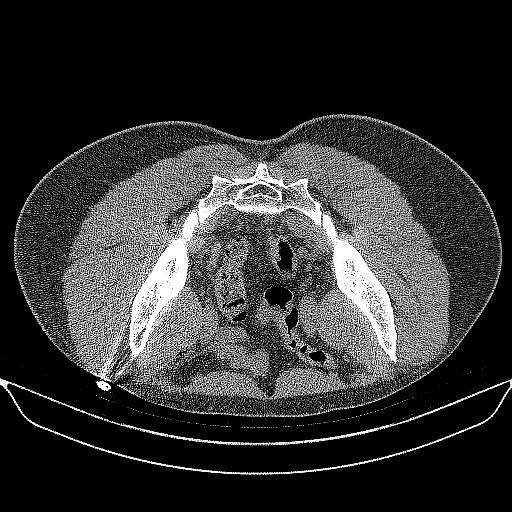
[im 19/28  soft-tissue]
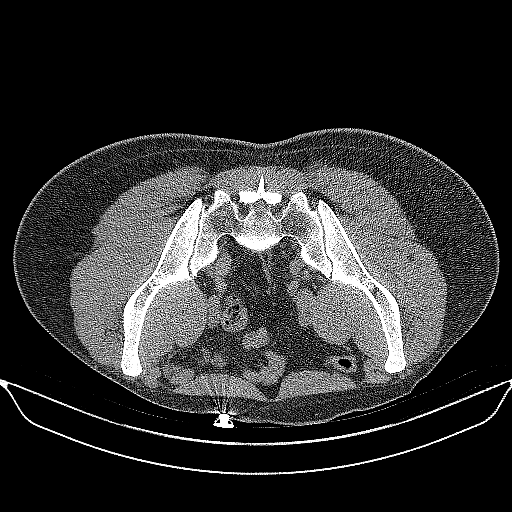

[Series 4: needle -guided injection · axial · 0.79mm/px · z∈[-385,-367]mm · 2 of 28 slices shown (3 of 4)]
[im 10/28  soft-tissue]
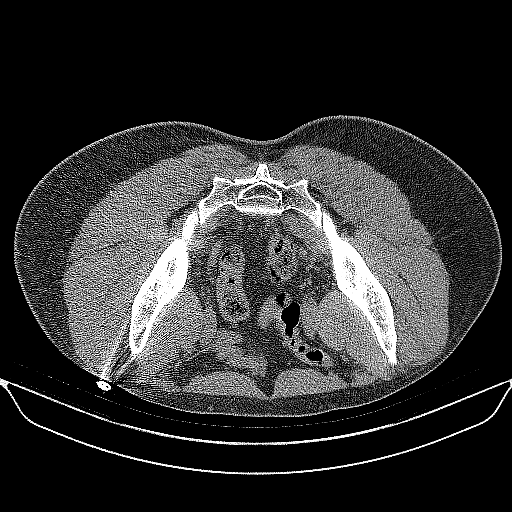
[im 19/28  soft-tissue]
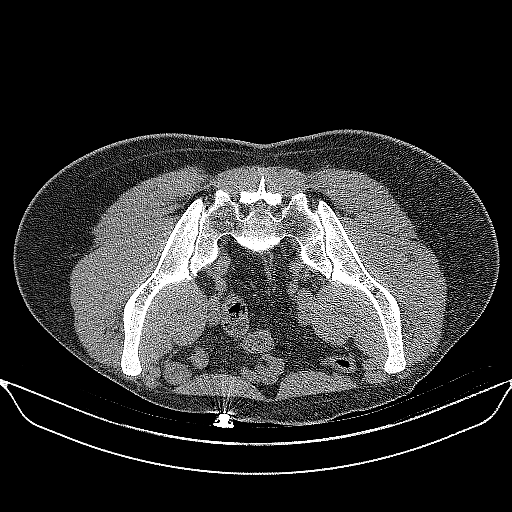

[Series 5: needle -guided injection · axial · 0.79mm/px · z∈[-385,-367]mm · 2 of 28 slices shown (4 of 4)]
[im 10/28  soft-tissue]
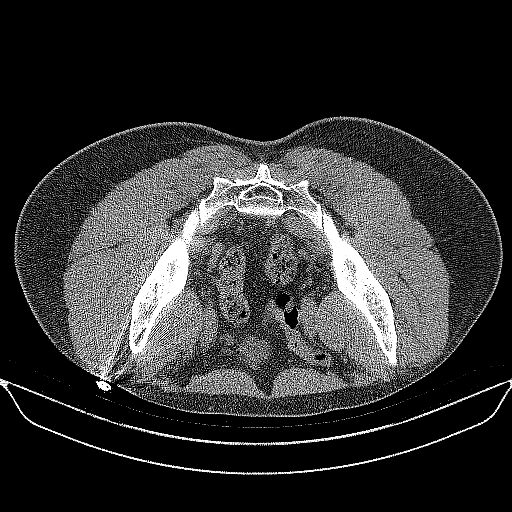
[im 19/28  soft-tissue]
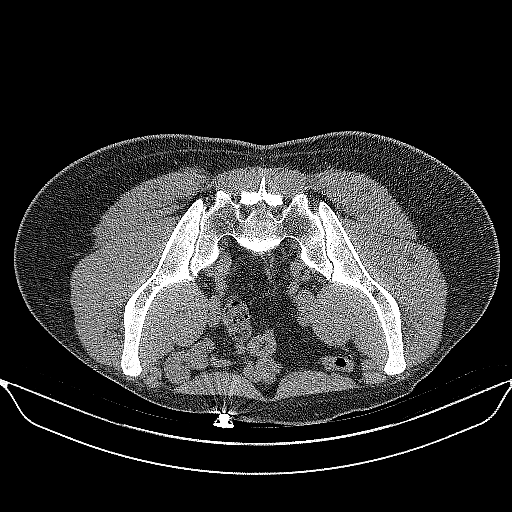

[12 of 32 positions shown; findings below may reference images not displayed]

EXAM:
CT GUIDED LEFT SI JOINT INJECTION

PROCEDURE:
After a thorough discussion of risks and benefits of the procedure,
including bleeding, infection, injury to nerves, blood vessels, and
adjacent structures, verbal and written consent was obtained. The
patient was placed prone on the CT table and localization was
performed over the sacrum. Target site marked using CT guidance. The
skin was prepped and draped in the usual sterile fashion using
Betadine soap.

After local anesthesia with 1% lidocaine without epinephrine and
subsequent deep anesthesia, a 3.5 inch 22 gauge spinal needle was
advanced into the left SI joint under intermittent CT guidance.

Injection of a small amount of Isovue-M 200 demonstrated
intra-articular contrast spread. Subsequently, 120 mg of Depo-Medrol
and 1 mL of 0.5% bupivacaine were injected into the left SI joint.
The needle was removed and a sterile dressing applied.

No complications were observed.
IMPRESSION: Successful CT-guided left SI joint injection.

## 2021-09-08 DIAGNOSIS — J3089 Other allergic rhinitis: Secondary | ICD-10-CM | POA: Diagnosis not present

## 2021-09-08 DIAGNOSIS — J301 Allergic rhinitis due to pollen: Secondary | ICD-10-CM | POA: Diagnosis not present

## 2021-09-23 DIAGNOSIS — M542 Cervicalgia: Secondary | ICD-10-CM | POA: Diagnosis not present

## 2021-09-23 DIAGNOSIS — M4692 Unspecified inflammatory spondylopathy, cervical region: Secondary | ICD-10-CM | POA: Diagnosis not present

## 2021-09-29 DIAGNOSIS — M25511 Pain in right shoulder: Secondary | ICD-10-CM | POA: Diagnosis not present

## 2021-09-29 DIAGNOSIS — M25512 Pain in left shoulder: Secondary | ICD-10-CM | POA: Diagnosis not present

## 2021-09-29 DIAGNOSIS — M25561 Pain in right knee: Secondary | ICD-10-CM | POA: Diagnosis not present

## 2021-09-30 DIAGNOSIS — J3089 Other allergic rhinitis: Secondary | ICD-10-CM | POA: Diagnosis not present

## 2021-09-30 DIAGNOSIS — M25512 Pain in left shoulder: Secondary | ICD-10-CM | POA: Diagnosis not present

## 2021-09-30 DIAGNOSIS — J301 Allergic rhinitis due to pollen: Secondary | ICD-10-CM | POA: Diagnosis not present

## 2021-10-03 DIAGNOSIS — Z13220 Encounter for screening for lipoid disorders: Secondary | ICD-10-CM | POA: Diagnosis not present

## 2021-10-03 DIAGNOSIS — Z125 Encounter for screening for malignant neoplasm of prostate: Secondary | ICD-10-CM | POA: Diagnosis not present

## 2021-10-03 DIAGNOSIS — Z0000 Encounter for general adult medical examination without abnormal findings: Secondary | ICD-10-CM | POA: Diagnosis not present

## 2021-10-03 DIAGNOSIS — E785 Hyperlipidemia, unspecified: Secondary | ICD-10-CM | POA: Diagnosis not present

## 2021-10-03 DIAGNOSIS — Z8249 Family history of ischemic heart disease and other diseases of the circulatory system: Secondary | ICD-10-CM | POA: Diagnosis not present

## 2021-10-03 DIAGNOSIS — R030 Elevated blood-pressure reading, without diagnosis of hypertension: Secondary | ICD-10-CM | POA: Diagnosis not present

## 2021-10-03 DIAGNOSIS — Z23 Encounter for immunization: Secondary | ICD-10-CM | POA: Diagnosis not present

## 2021-10-06 DIAGNOSIS — M25512 Pain in left shoulder: Secondary | ICD-10-CM | POA: Diagnosis not present

## 2021-10-06 DIAGNOSIS — M25511 Pain in right shoulder: Secondary | ICD-10-CM | POA: Diagnosis not present

## 2021-10-30 DIAGNOSIS — J301 Allergic rhinitis due to pollen: Secondary | ICD-10-CM | POA: Diagnosis not present

## 2021-10-30 DIAGNOSIS — J3089 Other allergic rhinitis: Secondary | ICD-10-CM | POA: Diagnosis not present

## 2021-11-25 DIAGNOSIS — H10413 Chronic giant papillary conjunctivitis, bilateral: Secondary | ICD-10-CM | POA: Diagnosis not present

## 2021-11-25 DIAGNOSIS — H52203 Unspecified astigmatism, bilateral: Secondary | ICD-10-CM | POA: Diagnosis not present

## 2021-11-25 DIAGNOSIS — H524 Presbyopia: Secondary | ICD-10-CM | POA: Diagnosis not present

## 2021-11-25 DIAGNOSIS — H5213 Myopia, bilateral: Secondary | ICD-10-CM | POA: Diagnosis not present

## 2021-11-28 DIAGNOSIS — J3089 Other allergic rhinitis: Secondary | ICD-10-CM | POA: Diagnosis not present

## 2021-11-28 DIAGNOSIS — J301 Allergic rhinitis due to pollen: Secondary | ICD-10-CM | POA: Diagnosis not present

## 2021-12-31 DIAGNOSIS — J301 Allergic rhinitis due to pollen: Secondary | ICD-10-CM | POA: Diagnosis not present

## 2021-12-31 DIAGNOSIS — J3089 Other allergic rhinitis: Secondary | ICD-10-CM | POA: Diagnosis not present

## 2022-01-08 DIAGNOSIS — M4802 Spinal stenosis, cervical region: Secondary | ICD-10-CM | POA: Diagnosis not present

## 2022-01-08 DIAGNOSIS — X58XXXA Exposure to other specified factors, initial encounter: Secondary | ICD-10-CM | POA: Diagnosis not present

## 2022-01-08 DIAGNOSIS — M7522 Bicipital tendinitis, left shoulder: Secondary | ICD-10-CM | POA: Diagnosis not present

## 2022-01-08 DIAGNOSIS — M7542 Impingement syndrome of left shoulder: Secondary | ICD-10-CM | POA: Diagnosis not present

## 2022-01-08 DIAGNOSIS — S43002A Unspecified subluxation of left shoulder joint, initial encounter: Secondary | ICD-10-CM | POA: Diagnosis not present

## 2022-01-08 DIAGNOSIS — S43432A Superior glenoid labrum lesion of left shoulder, initial encounter: Secondary | ICD-10-CM | POA: Diagnosis not present

## 2022-01-08 DIAGNOSIS — M24112 Other articular cartilage disorders, left shoulder: Secondary | ICD-10-CM | POA: Diagnosis not present

## 2022-01-08 DIAGNOSIS — G8918 Other acute postprocedural pain: Secondary | ICD-10-CM | POA: Diagnosis not present

## 2022-01-08 DIAGNOSIS — Y999 Unspecified external cause status: Secondary | ICD-10-CM | POA: Diagnosis not present

## 2022-01-08 DIAGNOSIS — M50122 Cervical disc disorder at C5-C6 level with radiculopathy: Secondary | ICD-10-CM | POA: Diagnosis not present

## 2022-01-08 DIAGNOSIS — M19012 Primary osteoarthritis, left shoulder: Secondary | ICD-10-CM | POA: Diagnosis not present

## 2022-01-08 DIAGNOSIS — S43492A Other sprain of left shoulder joint, initial encounter: Secondary | ICD-10-CM | POA: Diagnosis not present

## 2022-01-19 DIAGNOSIS — M19012 Primary osteoarthritis, left shoulder: Secondary | ICD-10-CM | POA: Diagnosis not present

## 2022-01-21 DIAGNOSIS — M25612 Stiffness of left shoulder, not elsewhere classified: Secondary | ICD-10-CM | POA: Diagnosis not present

## 2022-01-21 DIAGNOSIS — M7522 Bicipital tendinitis, left shoulder: Secondary | ICD-10-CM | POA: Diagnosis not present

## 2022-01-21 DIAGNOSIS — M6281 Muscle weakness (generalized): Secondary | ICD-10-CM | POA: Diagnosis not present

## 2022-01-21 DIAGNOSIS — M25512 Pain in left shoulder: Secondary | ICD-10-CM | POA: Diagnosis not present

## 2022-01-26 DIAGNOSIS — M7522 Bicipital tendinitis, left shoulder: Secondary | ICD-10-CM | POA: Diagnosis not present

## 2022-01-26 DIAGNOSIS — M25512 Pain in left shoulder: Secondary | ICD-10-CM | POA: Diagnosis not present

## 2022-01-26 DIAGNOSIS — M25612 Stiffness of left shoulder, not elsewhere classified: Secondary | ICD-10-CM | POA: Diagnosis not present

## 2022-01-26 DIAGNOSIS — M6281 Muscle weakness (generalized): Secondary | ICD-10-CM | POA: Diagnosis not present

## 2022-01-27 DIAGNOSIS — J301 Allergic rhinitis due to pollen: Secondary | ICD-10-CM | POA: Diagnosis not present

## 2022-01-27 DIAGNOSIS — J3089 Other allergic rhinitis: Secondary | ICD-10-CM | POA: Diagnosis not present

## 2022-01-28 DIAGNOSIS — M25512 Pain in left shoulder: Secondary | ICD-10-CM | POA: Diagnosis not present

## 2022-01-28 DIAGNOSIS — M25612 Stiffness of left shoulder, not elsewhere classified: Secondary | ICD-10-CM | POA: Diagnosis not present

## 2022-01-28 DIAGNOSIS — M6281 Muscle weakness (generalized): Secondary | ICD-10-CM | POA: Diagnosis not present

## 2022-01-28 DIAGNOSIS — M7522 Bicipital tendinitis, left shoulder: Secondary | ICD-10-CM | POA: Diagnosis not present

## 2022-02-03 DIAGNOSIS — M25512 Pain in left shoulder: Secondary | ICD-10-CM | POA: Diagnosis not present

## 2022-02-03 DIAGNOSIS — M6281 Muscle weakness (generalized): Secondary | ICD-10-CM | POA: Diagnosis not present

## 2022-02-03 DIAGNOSIS — M7522 Bicipital tendinitis, left shoulder: Secondary | ICD-10-CM | POA: Diagnosis not present

## 2022-02-03 DIAGNOSIS — M25612 Stiffness of left shoulder, not elsewhere classified: Secondary | ICD-10-CM | POA: Diagnosis not present

## 2022-02-17 DIAGNOSIS — M7522 Bicipital tendinitis, left shoulder: Secondary | ICD-10-CM | POA: Diagnosis not present

## 2022-02-17 DIAGNOSIS — M25612 Stiffness of left shoulder, not elsewhere classified: Secondary | ICD-10-CM | POA: Diagnosis not present

## 2022-02-17 DIAGNOSIS — M25512 Pain in left shoulder: Secondary | ICD-10-CM | POA: Diagnosis not present

## 2022-02-17 DIAGNOSIS — M6281 Muscle weakness (generalized): Secondary | ICD-10-CM | POA: Diagnosis not present

## 2022-02-23 DIAGNOSIS — M1711 Unilateral primary osteoarthritis, right knee: Secondary | ICD-10-CM | POA: Diagnosis not present

## 2022-03-02 DIAGNOSIS — M1711 Unilateral primary osteoarthritis, right knee: Secondary | ICD-10-CM | POA: Diagnosis not present

## 2022-03-03 DIAGNOSIS — J3089 Other allergic rhinitis: Secondary | ICD-10-CM | POA: Diagnosis not present

## 2022-03-03 DIAGNOSIS — J301 Allergic rhinitis due to pollen: Secondary | ICD-10-CM | POA: Diagnosis not present

## 2022-03-04 DIAGNOSIS — M7522 Bicipital tendinitis, left shoulder: Secondary | ICD-10-CM | POA: Diagnosis not present

## 2022-03-04 DIAGNOSIS — M25612 Stiffness of left shoulder, not elsewhere classified: Secondary | ICD-10-CM | POA: Diagnosis not present

## 2022-03-04 DIAGNOSIS — M6281 Muscle weakness (generalized): Secondary | ICD-10-CM | POA: Diagnosis not present

## 2022-03-04 DIAGNOSIS — M25512 Pain in left shoulder: Secondary | ICD-10-CM | POA: Diagnosis not present

## 2022-03-09 DIAGNOSIS — M1711 Unilateral primary osteoarthritis, right knee: Secondary | ICD-10-CM | POA: Diagnosis not present

## 2022-03-17 DIAGNOSIS — M7522 Bicipital tendinitis, left shoulder: Secondary | ICD-10-CM | POA: Diagnosis not present

## 2022-03-17 DIAGNOSIS — M6281 Muscle weakness (generalized): Secondary | ICD-10-CM | POA: Diagnosis not present

## 2022-03-17 DIAGNOSIS — M25612 Stiffness of left shoulder, not elsewhere classified: Secondary | ICD-10-CM | POA: Diagnosis not present

## 2022-03-17 DIAGNOSIS — M25512 Pain in left shoulder: Secondary | ICD-10-CM | POA: Diagnosis not present

## 2022-03-27 DIAGNOSIS — J3089 Other allergic rhinitis: Secondary | ICD-10-CM | POA: Diagnosis not present

## 2022-03-27 DIAGNOSIS — J301 Allergic rhinitis due to pollen: Secondary | ICD-10-CM | POA: Diagnosis not present

## 2022-03-27 DIAGNOSIS — J3081 Allergic rhinitis due to animal (cat) (dog) hair and dander: Secondary | ICD-10-CM | POA: Diagnosis not present

## 2022-03-31 DIAGNOSIS — M7522 Bicipital tendinitis, left shoulder: Secondary | ICD-10-CM | POA: Diagnosis not present

## 2022-03-31 DIAGNOSIS — M25512 Pain in left shoulder: Secondary | ICD-10-CM | POA: Diagnosis not present

## 2022-03-31 DIAGNOSIS — M6281 Muscle weakness (generalized): Secondary | ICD-10-CM | POA: Diagnosis not present

## 2022-03-31 DIAGNOSIS — M25612 Stiffness of left shoulder, not elsewhere classified: Secondary | ICD-10-CM | POA: Diagnosis not present

## 2022-04-29 DIAGNOSIS — J301 Allergic rhinitis due to pollen: Secondary | ICD-10-CM | POA: Diagnosis not present

## 2022-04-29 DIAGNOSIS — J3089 Other allergic rhinitis: Secondary | ICD-10-CM | POA: Diagnosis not present

## 2022-05-26 DIAGNOSIS — J301 Allergic rhinitis due to pollen: Secondary | ICD-10-CM | POA: Diagnosis not present

## 2022-05-26 DIAGNOSIS — J3081 Allergic rhinitis due to animal (cat) (dog) hair and dander: Secondary | ICD-10-CM | POA: Diagnosis not present

## 2022-05-26 DIAGNOSIS — J3089 Other allergic rhinitis: Secondary | ICD-10-CM | POA: Diagnosis not present

## 2022-07-03 DIAGNOSIS — J3081 Allergic rhinitis due to animal (cat) (dog) hair and dander: Secondary | ICD-10-CM | POA: Diagnosis not present

## 2022-07-03 DIAGNOSIS — J3089 Other allergic rhinitis: Secondary | ICD-10-CM | POA: Diagnosis not present

## 2022-07-03 DIAGNOSIS — J301 Allergic rhinitis due to pollen: Secondary | ICD-10-CM | POA: Diagnosis not present

## 2022-07-27 DIAGNOSIS — J301 Allergic rhinitis due to pollen: Secondary | ICD-10-CM | POA: Diagnosis not present

## 2022-07-27 DIAGNOSIS — J3089 Other allergic rhinitis: Secondary | ICD-10-CM | POA: Diagnosis not present

## 2022-07-28 DIAGNOSIS — M542 Cervicalgia: Secondary | ICD-10-CM | POA: Diagnosis not present

## 2022-08-05 DIAGNOSIS — M5412 Radiculopathy, cervical region: Secondary | ICD-10-CM | POA: Diagnosis not present

## 2022-08-12 DIAGNOSIS — M5412 Radiculopathy, cervical region: Secondary | ICD-10-CM | POA: Diagnosis not present

## 2022-08-19 DIAGNOSIS — M5412 Radiculopathy, cervical region: Secondary | ICD-10-CM | POA: Diagnosis not present

## 2022-08-26 DIAGNOSIS — M5412 Radiculopathy, cervical region: Secondary | ICD-10-CM | POA: Diagnosis not present

## 2022-08-28 DIAGNOSIS — J3089 Other allergic rhinitis: Secondary | ICD-10-CM | POA: Diagnosis not present

## 2022-08-28 DIAGNOSIS — J301 Allergic rhinitis due to pollen: Secondary | ICD-10-CM | POA: Diagnosis not present

## 2022-08-28 DIAGNOSIS — J3081 Allergic rhinitis due to animal (cat) (dog) hair and dander: Secondary | ICD-10-CM | POA: Diagnosis not present

## 2022-09-08 DIAGNOSIS — J3089 Other allergic rhinitis: Secondary | ICD-10-CM | POA: Diagnosis not present

## 2022-09-08 DIAGNOSIS — J301 Allergic rhinitis due to pollen: Secondary | ICD-10-CM | POA: Diagnosis not present

## 2022-09-10 DIAGNOSIS — M5412 Radiculopathy, cervical region: Secondary | ICD-10-CM | POA: Diagnosis not present

## 2022-10-06 DIAGNOSIS — J3089 Other allergic rhinitis: Secondary | ICD-10-CM | POA: Diagnosis not present

## 2022-10-06 DIAGNOSIS — J301 Allergic rhinitis due to pollen: Secondary | ICD-10-CM | POA: Diagnosis not present

## 2022-10-08 DIAGNOSIS — J3089 Other allergic rhinitis: Secondary | ICD-10-CM | POA: Diagnosis not present

## 2022-10-08 DIAGNOSIS — J3081 Allergic rhinitis due to animal (cat) (dog) hair and dander: Secondary | ICD-10-CM | POA: Diagnosis not present

## 2022-10-15 DIAGNOSIS — J301 Allergic rhinitis due to pollen: Secondary | ICD-10-CM | POA: Diagnosis not present

## 2022-10-15 DIAGNOSIS — Z23 Encounter for immunization: Secondary | ICD-10-CM | POA: Diagnosis not present

## 2022-10-21 DIAGNOSIS — J301 Allergic rhinitis due to pollen: Secondary | ICD-10-CM | POA: Diagnosis not present

## 2022-10-21 DIAGNOSIS — J3089 Other allergic rhinitis: Secondary | ICD-10-CM | POA: Diagnosis not present

## 2022-10-22 DIAGNOSIS — K649 Unspecified hemorrhoids: Secondary | ICD-10-CM | POA: Diagnosis not present

## 2022-10-22 DIAGNOSIS — Z1211 Encounter for screening for malignant neoplasm of colon: Secondary | ICD-10-CM | POA: Diagnosis not present

## 2022-10-27 DIAGNOSIS — J3089 Other allergic rhinitis: Secondary | ICD-10-CM | POA: Diagnosis not present

## 2022-10-27 DIAGNOSIS — J301 Allergic rhinitis due to pollen: Secondary | ICD-10-CM | POA: Diagnosis not present

## 2022-10-27 DIAGNOSIS — J3081 Allergic rhinitis due to animal (cat) (dog) hair and dander: Secondary | ICD-10-CM | POA: Diagnosis not present

## 2022-11-03 DIAGNOSIS — J301 Allergic rhinitis due to pollen: Secondary | ICD-10-CM | POA: Diagnosis not present

## 2022-11-03 DIAGNOSIS — J3081 Allergic rhinitis due to animal (cat) (dog) hair and dander: Secondary | ICD-10-CM | POA: Diagnosis not present

## 2022-11-03 DIAGNOSIS — J3089 Other allergic rhinitis: Secondary | ICD-10-CM | POA: Diagnosis not present

## 2022-11-25 DIAGNOSIS — J301 Allergic rhinitis due to pollen: Secondary | ICD-10-CM | POA: Diagnosis not present

## 2022-11-25 DIAGNOSIS — J3089 Other allergic rhinitis: Secondary | ICD-10-CM | POA: Diagnosis not present

## 2022-12-01 DIAGNOSIS — H524 Presbyopia: Secondary | ICD-10-CM | POA: Diagnosis not present

## 2022-12-01 DIAGNOSIS — H25012 Cortical age-related cataract, left eye: Secondary | ICD-10-CM | POA: Diagnosis not present

## 2022-12-01 DIAGNOSIS — H5213 Myopia, bilateral: Secondary | ICD-10-CM | POA: Diagnosis not present

## 2022-12-01 DIAGNOSIS — H52203 Unspecified astigmatism, bilateral: Secondary | ICD-10-CM | POA: Diagnosis not present

## 2022-12-16 DIAGNOSIS — M25511 Pain in right shoulder: Secondary | ICD-10-CM | POA: Diagnosis not present

## 2022-12-16 DIAGNOSIS — M542 Cervicalgia: Secondary | ICD-10-CM | POA: Diagnosis not present

## 2022-12-16 DIAGNOSIS — E668 Other obesity: Secondary | ICD-10-CM | POA: Diagnosis not present

## 2022-12-16 DIAGNOSIS — R7309 Other abnormal glucose: Secondary | ICD-10-CM | POA: Diagnosis not present

## 2022-12-16 DIAGNOSIS — E785 Hyperlipidemia, unspecified: Secondary | ICD-10-CM | POA: Diagnosis not present

## 2022-12-16 DIAGNOSIS — Z0000 Encounter for general adult medical examination without abnormal findings: Secondary | ICD-10-CM | POA: Diagnosis not present

## 2022-12-16 DIAGNOSIS — Z125 Encounter for screening for malignant neoplasm of prostate: Secondary | ICD-10-CM | POA: Diagnosis not present

## 2022-12-29 DIAGNOSIS — J3089 Other allergic rhinitis: Secondary | ICD-10-CM | POA: Diagnosis not present

## 2022-12-29 DIAGNOSIS — J3081 Allergic rhinitis due to animal (cat) (dog) hair and dander: Secondary | ICD-10-CM | POA: Diagnosis not present

## 2022-12-29 DIAGNOSIS — J301 Allergic rhinitis due to pollen: Secondary | ICD-10-CM | POA: Diagnosis not present

## 2023-02-02 DIAGNOSIS — J3081 Allergic rhinitis due to animal (cat) (dog) hair and dander: Secondary | ICD-10-CM | POA: Diagnosis not present

## 2023-02-02 DIAGNOSIS — J3089 Other allergic rhinitis: Secondary | ICD-10-CM | POA: Diagnosis not present

## 2023-02-02 DIAGNOSIS — J301 Allergic rhinitis due to pollen: Secondary | ICD-10-CM | POA: Diagnosis not present

## 2023-02-03 DIAGNOSIS — E785 Hyperlipidemia, unspecified: Secondary | ICD-10-CM | POA: Diagnosis not present

## 2023-02-03 DIAGNOSIS — E6609 Other obesity due to excess calories: Secondary | ICD-10-CM | POA: Diagnosis not present

## 2023-02-03 DIAGNOSIS — Z6831 Body mass index (BMI) 31.0-31.9, adult: Secondary | ICD-10-CM | POA: Diagnosis not present

## 2023-02-03 DIAGNOSIS — E8889 Other specified metabolic disorders: Secondary | ICD-10-CM | POA: Diagnosis not present

## 2023-02-17 DIAGNOSIS — E6609 Other obesity due to excess calories: Secondary | ICD-10-CM | POA: Diagnosis not present

## 2023-02-17 DIAGNOSIS — Z6830 Body mass index (BMI) 30.0-30.9, adult: Secondary | ICD-10-CM | POA: Diagnosis not present

## 2023-02-17 DIAGNOSIS — E785 Hyperlipidemia, unspecified: Secondary | ICD-10-CM | POA: Diagnosis not present

## 2023-02-25 DIAGNOSIS — J3081 Allergic rhinitis due to animal (cat) (dog) hair and dander: Secondary | ICD-10-CM | POA: Diagnosis not present

## 2023-02-25 DIAGNOSIS — J301 Allergic rhinitis due to pollen: Secondary | ICD-10-CM | POA: Diagnosis not present

## 2023-02-25 DIAGNOSIS — J3089 Other allergic rhinitis: Secondary | ICD-10-CM | POA: Diagnosis not present

## 2023-03-03 DIAGNOSIS — Z6830 Body mass index (BMI) 30.0-30.9, adult: Secondary | ICD-10-CM | POA: Diagnosis not present

## 2023-03-03 DIAGNOSIS — E785 Hyperlipidemia, unspecified: Secondary | ICD-10-CM | POA: Diagnosis not present

## 2023-03-03 DIAGNOSIS — E6609 Other obesity due to excess calories: Secondary | ICD-10-CM | POA: Diagnosis not present

## 2023-03-29 DIAGNOSIS — J301 Allergic rhinitis due to pollen: Secondary | ICD-10-CM | POA: Diagnosis not present

## 2023-03-29 DIAGNOSIS — J3081 Allergic rhinitis due to animal (cat) (dog) hair and dander: Secondary | ICD-10-CM | POA: Diagnosis not present

## 2023-03-29 DIAGNOSIS — J3089 Other allergic rhinitis: Secondary | ICD-10-CM | POA: Diagnosis not present

## 2023-03-31 DIAGNOSIS — E663 Overweight: Secondary | ICD-10-CM | POA: Diagnosis not present

## 2023-03-31 DIAGNOSIS — E785 Hyperlipidemia, unspecified: Secondary | ICD-10-CM | POA: Diagnosis not present

## 2023-04-30 DIAGNOSIS — J3081 Allergic rhinitis due to animal (cat) (dog) hair and dander: Secondary | ICD-10-CM | POA: Diagnosis not present

## 2023-04-30 DIAGNOSIS — J301 Allergic rhinitis due to pollen: Secondary | ICD-10-CM | POA: Diagnosis not present

## 2023-04-30 DIAGNOSIS — J3089 Other allergic rhinitis: Secondary | ICD-10-CM | POA: Diagnosis not present

## 2023-05-07 DIAGNOSIS — J3089 Other allergic rhinitis: Secondary | ICD-10-CM | POA: Diagnosis not present

## 2023-05-07 DIAGNOSIS — J301 Allergic rhinitis due to pollen: Secondary | ICD-10-CM | POA: Diagnosis not present

## 2023-05-17 DIAGNOSIS — Z9189 Other specified personal risk factors, not elsewhere classified: Secondary | ICD-10-CM | POA: Diagnosis not present

## 2023-05-17 DIAGNOSIS — E663 Overweight: Secondary | ICD-10-CM | POA: Diagnosis not present

## 2023-05-17 DIAGNOSIS — E785 Hyperlipidemia, unspecified: Secondary | ICD-10-CM | POA: Diagnosis not present

## 2023-05-19 DIAGNOSIS — M5412 Radiculopathy, cervical region: Secondary | ICD-10-CM | POA: Diagnosis not present

## 2023-05-26 DIAGNOSIS — M5412 Radiculopathy, cervical region: Secondary | ICD-10-CM | POA: Diagnosis not present

## 2023-06-01 DIAGNOSIS — J3081 Allergic rhinitis due to animal (cat) (dog) hair and dander: Secondary | ICD-10-CM | POA: Diagnosis not present

## 2023-06-01 DIAGNOSIS — J301 Allergic rhinitis due to pollen: Secondary | ICD-10-CM | POA: Diagnosis not present

## 2023-06-01 DIAGNOSIS — J3089 Other allergic rhinitis: Secondary | ICD-10-CM | POA: Diagnosis not present

## 2023-06-02 DIAGNOSIS — M5412 Radiculopathy, cervical region: Secondary | ICD-10-CM | POA: Diagnosis not present

## 2023-06-09 DIAGNOSIS — M5412 Radiculopathy, cervical region: Secondary | ICD-10-CM | POA: Diagnosis not present

## 2023-06-22 DIAGNOSIS — M5412 Radiculopathy, cervical region: Secondary | ICD-10-CM | POA: Diagnosis not present

## 2023-06-28 DIAGNOSIS — J3089 Other allergic rhinitis: Secondary | ICD-10-CM | POA: Diagnosis not present

## 2023-06-28 DIAGNOSIS — J3081 Allergic rhinitis due to animal (cat) (dog) hair and dander: Secondary | ICD-10-CM | POA: Diagnosis not present

## 2023-06-28 DIAGNOSIS — J301 Allergic rhinitis due to pollen: Secondary | ICD-10-CM | POA: Diagnosis not present

## 2023-07-08 DIAGNOSIS — J301 Allergic rhinitis due to pollen: Secondary | ICD-10-CM | POA: Diagnosis not present

## 2023-07-08 DIAGNOSIS — J3089 Other allergic rhinitis: Secondary | ICD-10-CM | POA: Diagnosis not present

## 2023-07-14 DIAGNOSIS — J3081 Allergic rhinitis due to animal (cat) (dog) hair and dander: Secondary | ICD-10-CM | POA: Diagnosis not present

## 2023-07-14 DIAGNOSIS — J301 Allergic rhinitis due to pollen: Secondary | ICD-10-CM | POA: Diagnosis not present

## 2023-07-14 DIAGNOSIS — E785 Hyperlipidemia, unspecified: Secondary | ICD-10-CM | POA: Diagnosis not present

## 2023-07-14 DIAGNOSIS — K219 Gastro-esophageal reflux disease without esophagitis: Secondary | ICD-10-CM | POA: Diagnosis not present

## 2023-07-14 DIAGNOSIS — J3089 Other allergic rhinitis: Secondary | ICD-10-CM | POA: Diagnosis not present

## 2023-07-14 DIAGNOSIS — E663 Overweight: Secondary | ICD-10-CM | POA: Diagnosis not present

## 2023-07-19 DIAGNOSIS — J3081 Allergic rhinitis due to animal (cat) (dog) hair and dander: Secondary | ICD-10-CM | POA: Diagnosis not present

## 2023-07-19 DIAGNOSIS — J3089 Other allergic rhinitis: Secondary | ICD-10-CM | POA: Diagnosis not present

## 2023-07-19 DIAGNOSIS — J301 Allergic rhinitis due to pollen: Secondary | ICD-10-CM | POA: Diagnosis not present

## 2023-07-26 DIAGNOSIS — J3081 Allergic rhinitis due to animal (cat) (dog) hair and dander: Secondary | ICD-10-CM | POA: Diagnosis not present

## 2023-07-26 DIAGNOSIS — J301 Allergic rhinitis due to pollen: Secondary | ICD-10-CM | POA: Diagnosis not present

## 2023-07-26 DIAGNOSIS — J3089 Other allergic rhinitis: Secondary | ICD-10-CM | POA: Diagnosis not present

## 2023-08-02 DIAGNOSIS — J3081 Allergic rhinitis due to animal (cat) (dog) hair and dander: Secondary | ICD-10-CM | POA: Diagnosis not present

## 2023-08-02 DIAGNOSIS — J3089 Other allergic rhinitis: Secondary | ICD-10-CM | POA: Diagnosis not present

## 2023-08-02 DIAGNOSIS — J301 Allergic rhinitis due to pollen: Secondary | ICD-10-CM | POA: Diagnosis not present

## 2023-08-26 DIAGNOSIS — J3089 Other allergic rhinitis: Secondary | ICD-10-CM | POA: Diagnosis not present

## 2023-08-26 DIAGNOSIS — J301 Allergic rhinitis due to pollen: Secondary | ICD-10-CM | POA: Diagnosis not present

## 2023-08-26 DIAGNOSIS — J3081 Allergic rhinitis due to animal (cat) (dog) hair and dander: Secondary | ICD-10-CM | POA: Diagnosis not present

## 2023-09-02 DIAGNOSIS — K219 Gastro-esophageal reflux disease without esophagitis: Secondary | ICD-10-CM | POA: Diagnosis not present

## 2023-09-02 DIAGNOSIS — Z9189 Other specified personal risk factors, not elsewhere classified: Secondary | ICD-10-CM | POA: Diagnosis not present

## 2023-09-02 DIAGNOSIS — E785 Hyperlipidemia, unspecified: Secondary | ICD-10-CM | POA: Diagnosis not present

## 2023-09-09 DIAGNOSIS — J3089 Other allergic rhinitis: Secondary | ICD-10-CM | POA: Diagnosis not present

## 2023-09-09 DIAGNOSIS — J3081 Allergic rhinitis due to animal (cat) (dog) hair and dander: Secondary | ICD-10-CM | POA: Diagnosis not present

## 2023-09-09 DIAGNOSIS — J301 Allergic rhinitis due to pollen: Secondary | ICD-10-CM | POA: Diagnosis not present

## 2023-09-29 DIAGNOSIS — J3081 Allergic rhinitis due to animal (cat) (dog) hair and dander: Secondary | ICD-10-CM | POA: Diagnosis not present

## 2023-09-29 DIAGNOSIS — J3089 Other allergic rhinitis: Secondary | ICD-10-CM | POA: Diagnosis not present

## 2023-09-29 DIAGNOSIS — Z23 Encounter for immunization: Secondary | ICD-10-CM | POA: Diagnosis not present

## 2023-09-29 DIAGNOSIS — J301 Allergic rhinitis due to pollen: Secondary | ICD-10-CM | POA: Diagnosis not present

## 2023-10-19 DIAGNOSIS — E785 Hyperlipidemia, unspecified: Secondary | ICD-10-CM | POA: Diagnosis not present

## 2023-10-19 DIAGNOSIS — K219 Gastro-esophageal reflux disease without esophagitis: Secondary | ICD-10-CM | POA: Diagnosis not present

## 2023-10-29 DIAGNOSIS — J3089 Other allergic rhinitis: Secondary | ICD-10-CM | POA: Diagnosis not present

## 2023-10-29 DIAGNOSIS — J301 Allergic rhinitis due to pollen: Secondary | ICD-10-CM | POA: Diagnosis not present

## 2023-10-29 DIAGNOSIS — J3081 Allergic rhinitis due to animal (cat) (dog) hair and dander: Secondary | ICD-10-CM | POA: Diagnosis not present

## 2023-11-08 DIAGNOSIS — M25511 Pain in right shoulder: Secondary | ICD-10-CM | POA: Diagnosis not present

## 2023-11-30 DIAGNOSIS — J3081 Allergic rhinitis due to animal (cat) (dog) hair and dander: Secondary | ICD-10-CM | POA: Diagnosis not present

## 2023-11-30 DIAGNOSIS — J301 Allergic rhinitis due to pollen: Secondary | ICD-10-CM | POA: Diagnosis not present

## 2023-11-30 DIAGNOSIS — J3089 Other allergic rhinitis: Secondary | ICD-10-CM | POA: Diagnosis not present

## 2023-12-02 DIAGNOSIS — J301 Allergic rhinitis due to pollen: Secondary | ICD-10-CM | POA: Diagnosis not present

## 2023-12-02 DIAGNOSIS — J3089 Other allergic rhinitis: Secondary | ICD-10-CM | POA: Diagnosis not present

## 2023-12-08 DIAGNOSIS — H25013 Cortical age-related cataract, bilateral: Secondary | ICD-10-CM | POA: Diagnosis not present

## 2023-12-08 DIAGNOSIS — H524 Presbyopia: Secondary | ICD-10-CM | POA: Diagnosis not present

## 2023-12-08 DIAGNOSIS — H5213 Myopia, bilateral: Secondary | ICD-10-CM | POA: Diagnosis not present

## 2023-12-30 DIAGNOSIS — J3089 Other allergic rhinitis: Secondary | ICD-10-CM | POA: Diagnosis not present

## 2023-12-30 DIAGNOSIS — J3081 Allergic rhinitis due to animal (cat) (dog) hair and dander: Secondary | ICD-10-CM | POA: Diagnosis not present

## 2023-12-30 DIAGNOSIS — E663 Overweight: Secondary | ICD-10-CM | POA: Diagnosis not present

## 2023-12-30 DIAGNOSIS — J301 Allergic rhinitis due to pollen: Secondary | ICD-10-CM | POA: Diagnosis not present

## 2023-12-30 DIAGNOSIS — Z125 Encounter for screening for malignant neoplasm of prostate: Secondary | ICD-10-CM | POA: Diagnosis not present

## 2023-12-30 DIAGNOSIS — Z0000 Encounter for general adult medical examination without abnormal findings: Secondary | ICD-10-CM | POA: Diagnosis not present

## 2023-12-30 DIAGNOSIS — E785 Hyperlipidemia, unspecified: Secondary | ICD-10-CM | POA: Diagnosis not present

## 2023-12-30 DIAGNOSIS — M7918 Myalgia, other site: Secondary | ICD-10-CM | POA: Diagnosis not present

## 2023-12-30 DIAGNOSIS — K219 Gastro-esophageal reflux disease without esophagitis: Secondary | ICD-10-CM | POA: Diagnosis not present

## 2024-01-03 DIAGNOSIS — M25511 Pain in right shoulder: Secondary | ICD-10-CM | POA: Diagnosis not present

## 2024-01-10 DIAGNOSIS — M25511 Pain in right shoulder: Secondary | ICD-10-CM | POA: Diagnosis not present

## 2024-01-13 DIAGNOSIS — E785 Hyperlipidemia, unspecified: Secondary | ICD-10-CM | POA: Diagnosis not present

## 2024-01-13 DIAGNOSIS — Z79899 Other long term (current) drug therapy: Secondary | ICD-10-CM | POA: Diagnosis not present

## 2024-01-13 DIAGNOSIS — E663 Overweight: Secondary | ICD-10-CM | POA: Diagnosis not present

## 2024-01-13 DIAGNOSIS — K219 Gastro-esophageal reflux disease without esophagitis: Secondary | ICD-10-CM | POA: Diagnosis not present

## 2024-01-13 DIAGNOSIS — Z6827 Body mass index (BMI) 27.0-27.9, adult: Secondary | ICD-10-CM | POA: Diagnosis not present

## 2024-01-24 DIAGNOSIS — M25511 Pain in right shoulder: Secondary | ICD-10-CM | POA: Diagnosis not present

## 2024-01-27 DIAGNOSIS — J301 Allergic rhinitis due to pollen: Secondary | ICD-10-CM | POA: Diagnosis not present

## 2024-01-27 DIAGNOSIS — J3089 Other allergic rhinitis: Secondary | ICD-10-CM | POA: Diagnosis not present

## 2024-01-27 DIAGNOSIS — J3081 Allergic rhinitis due to animal (cat) (dog) hair and dander: Secondary | ICD-10-CM | POA: Diagnosis not present

## 2024-02-04 DIAGNOSIS — J3089 Other allergic rhinitis: Secondary | ICD-10-CM | POA: Diagnosis not present

## 2024-02-04 DIAGNOSIS — J301 Allergic rhinitis due to pollen: Secondary | ICD-10-CM | POA: Diagnosis not present

## 2024-02-04 NOTE — Progress Notes (Signed)
[facility] [address] [place] [address] Phone: [phone] Subjective:   Curtis Lewis, am serving as a scribe for Dr. Antoine Primas.  I'm seeing this patient by the request  of:  Deatra James, MD  CC: Right shoulder pain  [id number]  Curtis Lewis is a 53 y.o. male coming in with complaint of R shoulder pain.  Patient has had this for quite some time and has seen another provider for it.  Patient was seen orthopedics and did have an MRI.  MRI was brought in today.  Independently visualized off of the CD that did show the patient does have a high-grade partial-thickness tear of the subscapularis tendon that seems to involve 85 to 90% of the tendon.  No significant retraction noted at this time.  Patient though does have medial dislocation of the long head of the bicep tendon with moderate acromioclavicular arthritis.  Due to the known contrast unable to fully assess the labrum.    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 Hx of L shoulder surgery by Dr. Eulah Pont. Taking meloxicam and making modifications in gym which have helped to improve his pain. 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       Past Medical History:  Diagnosis Date   Hyperlipidemia    Lipoma    on back   Osteoarthritis of right knee    Past Surgical History:  Procedure Laterality Date   APPENDECTOMY     KNEE ARTHROSCOPY W/ ACL RECONSTRUCTION     x3 on right   KNEE BURSECTOMY     left   Social History   Socioeconomic History   Marital status: Married    Spouse name: Not on file   Number of children: Not on file   Years of education: Not on file   Highest education level: Not on file  Occupational History   Not on file  Tobacco Use   Smoking status: Never   Smokeless tobacco:  Never  Substance and Sexual Activity   Alcohol use: Yes    Alcohol/week: 2.0 standard drinks of alcohol    Types: 2 Glasses of wine per week   Drug use: No   Sexual activity: Not on file  Other Topics Concern   Not on file  Social History Narrative   Not on file   Social Drivers of Health   Financial Resource Strain: Not on file  Food Insecurity: Not on file  Transportation Needs: Not on file  Physical Activity: Not on file  Stress: Not on file  Social Connections: Not on file   Allergies  Allergen Reactions   Percocet [Oxycodone-Acetaminophen] Itching and Nausea And Vomiting   Family History  Problem Relation Age of Onset   Cancer Maternal Grandmother        lung     Current Outpatient Medications (Cardiovascular):    atorvastatin (LIPITOR) 20 MG tablet, daily.   nitroGLYCERIN (NITRO-DUR) 0.2 mg/hr patch, Apply 1/4 of a patch to skin once daily.   Current Outpatient Medications (Analgesics):    DUEXIS 800-26.6 MG TABS, Take 1 tablet by mouth 2 (two) times daily.   HYDROcodone-acetaminophen (NORCO/VICODIN) 5-325 MG tablet,    Current Outpatient Medications (Other):    methocarbamol (ROBAXIN) 500 MG tablet,  Reviewed prior external information including notes and imaging from  primary care provider As well as notes that were available from care everywhere and other healthcare systems.  Past medical history, social, surgical and family history all reviewed in electronic medical record.  No pertanent information unless stated regarding to the chief complaint.   Review of Systems:  No headache, visual changes, nausea, vomiting, diarrhea, constipation, dizziness, abdominal pain, skin rash, fevers, chills, night sweats, weight loss, swollen lymph nodes, body aches, joint swelling, chest pain, shortness of breath, mood changes. POSITIVE muscle aches  Objective  Blood pressure 110/82, pulse 96, height 5\' 10"  (1.778 m), weight 194 lb (88 kg), SpO2 96%.   General: No  apparent distress alert and oriented x3 mood and affect normal, dressed appropriately.  HEENT: Pupils equal, extraocular movements intact  Respiratory: Patient's speak in full sentences and does not appear short of breath  Cardiovascular: No lower extremity edema, non tender, no erythema  Right shoulder exam patient does have some mild weakness especially with the subscapularis.  Supraspinatus is strong but does have pain with empty can.  Mild positive crossover noted as well.  Limited muscular skeletal ultrasound was performed and interpreted by Antoine Primas, M   Limited ultrasound does show the patient does have some tearing noted in approximately 50% of the tendon of the supraspinatus.  Patient does have some intersubstance tearing potentially also noted of the supraspinatus.  No retraction noted.  Moderate acromioclavicular arthritis noted.   Impression and Recommendations:     The above documentation has been reviewed and is accurate and complete Judi Saa, DO

## 2024-02-07 DIAGNOSIS — J3089 Other allergic rhinitis: Secondary | ICD-10-CM | POA: Diagnosis not present

## 2024-02-07 DIAGNOSIS — J3081 Allergic rhinitis due to animal (cat) (dog) hair and dander: Secondary | ICD-10-CM | POA: Diagnosis not present

## 2024-02-07 DIAGNOSIS — J301 Allergic rhinitis due to pollen: Secondary | ICD-10-CM | POA: Diagnosis not present

## 2024-02-08 ENCOUNTER — Other Ambulatory Visit: Payer: Self-pay

## 2024-02-08 ENCOUNTER — Encounter: Payer: Self-pay | Admitting: Family Medicine

## 2024-02-08 ENCOUNTER — Ambulatory Visit (INDEPENDENT_AMBULATORY_CARE_PROVIDER_SITE_OTHER): Payer: BC Managed Care – PPO | Admitting: Family Medicine

## 2024-02-08 VITALS — BP 110/82 | HR 96 | Ht 70.0 in | Wt 194.0 lb

## 2024-02-08 DIAGNOSIS — M25511 Pain in right shoulder: Secondary | ICD-10-CM | POA: Diagnosis not present

## 2024-02-08 DIAGNOSIS — M75111 Incomplete rotator cuff tear or rupture of right shoulder, not specified as traumatic: Secondary | ICD-10-CM

## 2024-02-08 DIAGNOSIS — M75101 Unspecified rotator cuff tear or rupture of right shoulder, not specified as traumatic: Secondary | ICD-10-CM | POA: Insufficient documentation

## 2024-02-08 MED ORDER — NITROGLYCERIN 0.2 MG/HR TD PT24
MEDICATED_PATCH | TRANSDERMAL | 0 refills | Status: AC
Start: 2024-02-08 — End: ?

## 2024-02-08 NOTE — Assessment & Plan Note (Signed)
Patient does have a right rotator cuff tear that is on the MRI.  We discussed with patient about icing regimen and home exercises.  We discussed with patient about surgery versus nonsurgical intervention.  Discussed which activities to do and which ones to avoid.  Increase activity slowly.  We discussed different treatment options and patient is elected to try nitroglycerin patches.  Warned of potential side effects and any interaction with the Cialis would be potentially life-threatening.  Patient will not take the Cialis.  We discussed with patient about PRP as a potential as well.  The patient would like to avoid any surgical intervention until the winter if necessary.  With patient not having any significant retraction I think that this is a possibility.  Follow-up with me again 6 to 8 weeks.

## 2024-02-08 NOTE — Patient Instructions (Signed)
Nitroglycerin Protocol   Apply 1/4 nitroglycerin patch to affected area daily.  Change position of patch within the affected area every 24 hours.  You may experience a headache during the first 1-2 weeks of using the patch, these should subside.  If you experience headaches after beginning nitroglycerin patch treatment, you may take your preferred over the counter pain reliever.  Another side effect of the nitroglycerin patch is skin irritation or rash related to patch adhesive.  Please notify our office if you develop more severe headaches or rash, and stop the patch.  Tendon healing with nitroglycerin patch may require 12 to 24 weeks depending on the extent of injury.  Men should not use if taking Viagra, Cialis, or Levitra.   Do not use if you have migraines or rosacea. Arm compression sleeve with work and working out [employer] after activity See me in 6-8 weeks

## 2024-02-09 ENCOUNTER — Encounter: Payer: Self-pay | Admitting: Family Medicine

## 2024-02-11 DIAGNOSIS — J301 Allergic rhinitis due to pollen: Secondary | ICD-10-CM | POA: Diagnosis not present

## 2024-02-11 DIAGNOSIS — J3089 Other allergic rhinitis: Secondary | ICD-10-CM | POA: Diagnosis not present

## 2024-02-11 DIAGNOSIS — J3081 Allergic rhinitis due to animal (cat) (dog) hair and dander: Secondary | ICD-10-CM | POA: Diagnosis not present

## 2024-02-14 DIAGNOSIS — J3081 Allergic rhinitis due to animal (cat) (dog) hair and dander: Secondary | ICD-10-CM | POA: Diagnosis not present

## 2024-02-14 DIAGNOSIS — J3089 Other allergic rhinitis: Secondary | ICD-10-CM | POA: Diagnosis not present

## 2024-02-14 DIAGNOSIS — J301 Allergic rhinitis due to pollen: Secondary | ICD-10-CM | POA: Diagnosis not present

## 2024-02-18 DIAGNOSIS — J3089 Other allergic rhinitis: Secondary | ICD-10-CM | POA: Diagnosis not present

## 2024-02-18 DIAGNOSIS — J301 Allergic rhinitis due to pollen: Secondary | ICD-10-CM | POA: Diagnosis not present

## 2024-02-18 DIAGNOSIS — J3081 Allergic rhinitis due to animal (cat) (dog) hair and dander: Secondary | ICD-10-CM | POA: Diagnosis not present

## 2024-03-01 DIAGNOSIS — J3089 Other allergic rhinitis: Secondary | ICD-10-CM | POA: Diagnosis not present

## 2024-03-01 DIAGNOSIS — J301 Allergic rhinitis due to pollen: Secondary | ICD-10-CM | POA: Diagnosis not present

## 2024-03-01 DIAGNOSIS — J3081 Allergic rhinitis due to animal (cat) (dog) hair and dander: Secondary | ICD-10-CM | POA: Diagnosis not present

## 2024-03-17 NOTE — Progress Notes (Signed)
 [facility] [address] [place] [address] Phone: [phone] Subjective:    I'm seeing this patient by the request  of:  Deatra James, MD  CC: Right shoulder pain follow-up  [id number]  02/08/2024 Patient does have a right rotator cuff tear that is on the MRI.  We discussed with patient about icing regimen and home exercises.  We discussed with patient about surgery versus nonsurgical intervention.  Discussed which activities to do and which ones to avoid.  Increase activity slowly.  We discussed different treatment options and patient is elected to try nitroglycerin patches.  Warned of potential side effects and any interaction with the Cialis would be potentially life-threatening.  Patient will not take the Cialis.  We discussed with patient about PRP as a potential as well.  The patient would like to avoid any surgical intervention until the winter if necessary.  With patient not having any significant retraction I think that this is a possibility.  Follow-up with me again 6 to 8 weeks.      Update 03/21/2024 Curtis Lewis is a 53 y.o. male coming in with complaint of R shoulder pain. Paitent states that he is doing somewhat better. Modified his work. Notices bicep movement more than the shoulder pain. Has been using nitroglycerin  patches since last visit.        Past Medical History:  Diagnosis Date   Hyperlipidemia    Lipoma    on back   Osteoarthritis of right knee    Past Surgical History:  Procedure Laterality Date   APPENDECTOMY     KNEE ARTHROSCOPY W/ ACL RECONSTRUCTION     x3 on right   KNEE BURSECTOMY     left   Social History   Socioeconomic History   Marital status: Married    Spouse name: Not on file   Number of children: Not on file   Years of education: Not on file   Highest education level: Not on file  Occupational History   Not on file  Tobacco Use   Smoking status: Never   Smokeless tobacco: Never   Substance and Sexual Activity   Alcohol use: Yes    Alcohol/week: 2.0 standard drinks of alcohol    Types: 2 Glasses of wine per week   Drug use: No   Sexual activity: Not on file  Other Topics Concern   Not on file  Social History Narrative   Not on file   Social Drivers of Health   Financial Resource Strain: Not on file  Food Insecurity: Not on file  Transportation Needs: Not on file  Physical Activity: Not on file  Stress: Not on file  Social Connections: Not on file   Allergies  Allergen Reactions   Percocet [Oxycodone-Acetaminophen] Itching and Nausea And Vomiting   Family History  Problem Relation Age of Onset   Cancer Maternal Grandmother        lung     Current Outpatient Medications (Cardiovascular):    atorvastatin (LIPITOR) 20 MG tablet, daily.   nitroGLYCERIN (NITRO-DUR) 0.2 mg/hr patch, Apply 1/4 of a patch to skin once daily.       Reviewed prior external information including notes and imaging from  primary care provider As well as notes that were available from care everywhere and other healthcare systems.  Past medical history, social, surgical and family history all reviewed in electronic medical record.  No pertanent information unless stated regarding to the chief complaint.  Review of Systems:  No headache, visual changes, nausea, vomiting, diarrhea, constipation, dizziness, abdominal pain, skin rash, fevers, chills, night sweats, weight loss, swollen lymph nodes, body aches, joint swelling, chest pain, shortness of breath, mood changes. POSITIVE muscle aches  Objective  Blood pressure 108/78, height 5\' 10"  (1.778 m), weight 195 lb (88.5 kg).   General: No apparent distress alert and oriented x3 mood and affect normal, dressed appropriately.  HEENT: Pupils equal, extraocular movements intact  Respiratory: Patient's speak in full sentences and does not appear short of breath  Cardiovascular: No lower extremity edema, non tender, no  erythema  Right shoulder exam shows the patient is making improvement with range of motion.  Rotator cuff strength seems to be improved.  Continues to seem to be improving with the subscapularis.  Strength is well with near symmetric  Limited muscular skeletal ultrasound was performed and interpreted by Antoine Primas, M  Limited ultrasound shows a patient does have a good immune scar tissue noted.  This is beyond what we were anticipating.  Patient is high-grade partial-thickness tear seems to be improved.  Still has the underlying arthritic changes noted.  Significant decrease in the hypoechoic changes of the bicep tendon also noted. Impression: Interval improvement noted    Impression and Recommendations:    The above documentation has been reviewed and is accurate and complete Judi Saa, DO

## 2024-03-21 ENCOUNTER — Encounter: Payer: Self-pay | Admitting: Family Medicine

## 2024-03-21 ENCOUNTER — Ambulatory Visit: Payer: BC Managed Care – PPO | Admitting: Family Medicine

## 2024-03-21 ENCOUNTER — Other Ambulatory Visit: Payer: Self-pay

## 2024-03-21 VITALS — BP 108/78 | Ht 70.0 in | Wt 195.0 lb

## 2024-03-21 DIAGNOSIS — M75111 Incomplete rotator cuff tear or rupture of right shoulder, not specified as traumatic: Secondary | ICD-10-CM

## 2024-03-21 DIAGNOSIS — M25511 Pain in right shoulder: Secondary | ICD-10-CM

## 2024-03-21 NOTE — Assessment & Plan Note (Signed)
 At the moment it appears the patient is making a significant improvement.  Discussed briefly limited continue the nitroglycerin but patient is having significant side effects.  I want him to discontinue for now to see how he does and start increasing some activity.  Discussed icing regimen otherwise.  Worsening symptoms can consider the PRP.  His trouble trying to hold out if any surgical necessary he would want to wait till fall.  At this moment I am more optimistic that he may be able to avoid surgery.  Follow-up again in 2 months for further evaluation

## 2024-03-21 NOTE — Patient Instructions (Addendum)
 Healing well Stop the patches Move slow with activity vs more reps Keep hands in peripheral vision Ice after working out See me in 2 months   Turmeric 500mg  daily  Tart cherry extract 1200mg  at night Vitamin D 2000 IU daily

## 2024-03-29 DIAGNOSIS — J3089 Other allergic rhinitis: Secondary | ICD-10-CM | POA: Diagnosis not present

## 2024-03-29 DIAGNOSIS — J301 Allergic rhinitis due to pollen: Secondary | ICD-10-CM | POA: Diagnosis not present

## 2024-03-29 DIAGNOSIS — J3081 Allergic rhinitis due to animal (cat) (dog) hair and dander: Secondary | ICD-10-CM | POA: Diagnosis not present

## 2024-04-27 DIAGNOSIS — J301 Allergic rhinitis due to pollen: Secondary | ICD-10-CM | POA: Diagnosis not present

## 2024-04-27 DIAGNOSIS — J3081 Allergic rhinitis due to animal (cat) (dog) hair and dander: Secondary | ICD-10-CM | POA: Diagnosis not present

## 2024-04-27 DIAGNOSIS — J3089 Other allergic rhinitis: Secondary | ICD-10-CM | POA: Diagnosis not present

## 2024-05-18 NOTE — Progress Notes (Unsigned)
 [facility] [address] [place] [address] Phone: [phone] Subjective:   Curtis Lewis, am serving as a scribe for Dr. Ronnell Coins.  I'm seeing this patient by the request  of:  Sun, [name], MD  CC: Right shoulder pain follow-up  [id number]  03/21/2024 At the moment it appears the patient is making a significant improvement.  Discussed briefly limited continue the nitroglycerin  but patient is having significant side effects.  I want him to discontinue for now to see how he does and start increasing some activity.  Discussed icing regimen otherwise.  Worsening symptoms can consider the PRP.  His trouble trying to hold out if any surgical necessary he would want to wait till fall.  At this moment I am more optimistic that he may be able to avoid surgery.  Follow-up again in 2 months for further evaluation      Update 05/23/2024 Curtis Lewis is a 53 y.o. male coming in with complaint of R shoulder pain. Patient states that his shoulder has been doing much better.   Bicep continues to be painful. Wearing compression sleeve at the gym and with activity. Pain at proximal bicep. Has cramping from time to time.       Past Medical History:  Diagnosis Date   Hyperlipidemia    Lipoma    on back   Osteoarthritis of right knee    Past Surgical History:  Procedure Laterality Date   APPENDECTOMY     KNEE ARTHROSCOPY W/ ACL RECONSTRUCTION     x3 on right   KNEE BURSECTOMY     left   Social History   Socioeconomic History   Marital status: Married    Spouse name: Not on file   Number of children: Not on file   Years of education: Not on file   Highest education level: Not on file  Occupational History   Not on file  Tobacco Use   Smoking status: Never   Smokeless tobacco: Never  Substance and Sexual Activity   Alcohol use: Yes    Alcohol/week: 2.0 standard drinks of alcohol    Types: 2 Glasses of wine per week   Drug use: No    Sexual activity: Not on file  Other Topics Concern   Not on file  Social History Narrative   Not on file   Social Drivers of Health   Financial Resource Strain: Not on file  Food Insecurity: Not on file  Transportation Needs: Not on file  Physical Activity: Not on file  Stress: Not on file  Social Connections: Not on file   Allergies  Allergen Reactions   Percocet [Oxycodone-Acetaminophen ] Itching and Nausea And Vomiting   Family History  Problem Relation Age of Onset   Cancer Maternal Grandmother        lung     Current Outpatient Medications (Cardiovascular):    atorvastatin (LIPITOR) 20 MG tablet, daily.   nitroGLYCERIN  (NITRO-DUR ) 0.2 mg/hr patch, Apply 1/4 of a patch to skin once daily.       Reviewed prior external information including notes and imaging from  primary care provider As well as notes that were available from care everywhere and other healthcare systems.  Past medical history, social, surgical and family history all reviewed in electronic medical record.  No pertanent information unless stated regarding to the chief complaint.   Review of Systems:  No headache, visual changes, nausea, vomiting, diarrhea, constipation, dizziness, abdominal pain, skin rash, fevers,  chills, night sweats, weight loss, swollen lymph nodes, body aches, joint swelling, chest pain, shortness of breath, mood changes. POSITIVE muscle aches  Objective  Blood pressure 138/88, pulse 77, height 5\' 10"  (1.778 m), weight 200 lb (90.7 kg), SpO2 97%.   General: No apparent distress alert and oriented x3 mood and affect normal, dressed appropriately.  HEENT: Pupils equal, extraocular movements intact  Respiratory: Patient's speak in full sentences and does not appear short of breath  Cardiovascular: No lower extremity edema, non tender, no erythema  Right shoulder shows very mild positive Jurgenson's.  Very mild positive impingement.  5 out of 5 strength of the rotator cuff  compared to the contralateral side.   Limited muscular skeletal ultrasound was performed and interpreted by Ronnell Coins, M  Limited ultrasound shows some mild hypertrophy noted of the bicep tendon near the insertion of the anterior labrum.  Retinaculum does not appear to be intact and does have chronic subluxation of the bicep tendon medially.  Significant shallow bicipital groove noted.  Rotator cuff no appears to be significantly improved and well-healed at this time. 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

## 2024-05-24 ENCOUNTER — Ambulatory Visit (INDEPENDENT_AMBULATORY_CARE_PROVIDER_SITE_OTHER): Admitting: Family Medicine

## 2024-05-24 ENCOUNTER — Other Ambulatory Visit: Payer: Self-pay

## 2024-05-24 ENCOUNTER — Encounter: Payer: Self-pay | Admitting: Family Medicine

## 2024-05-24 VITALS — BP 138/88 | HR 77 | Ht 70.0 in | Wt 200.0 lb

## 2024-05-24 DIAGNOSIS — M25511 Pain in right shoulder: Secondary | ICD-10-CM | POA: Diagnosis not present

## 2024-05-24 DIAGNOSIS — M75111 Incomplete rotator cuff tear or rupture of right shoulder, not specified as traumatic: Secondary | ICD-10-CM

## 2024-05-24 NOTE — Assessment & Plan Note (Signed)
 Rotator cuff appears to be significantly improved but unfortunately continues to have chronic subluxation.  Patient has full strength noted.  The subluxation is a bicep tendon issue.  I do believe that the retinaculum is gone.  Discussed with patient about icing regimen and home exercises, discussed which activities to do, discussed changing from a compression sleeve to more of a strap that could be beneficial.  Will follow-up again in 3 months to make sure doing well.  Worsening pain will need to consider the possibility of advanced imaging.

## 2024-05-24 NOTE — Patient Instructions (Signed)
 Patella strap  Keep hands in peripheral vision No more chin ups See me again in 8 weeks

## 2024-05-30 DIAGNOSIS — J301 Allergic rhinitis due to pollen: Secondary | ICD-10-CM | POA: Diagnosis not present

## 2024-05-30 DIAGNOSIS — J3081 Allergic rhinitis due to animal (cat) (dog) hair and dander: Secondary | ICD-10-CM | POA: Diagnosis not present

## 2024-05-30 DIAGNOSIS — J3089 Other allergic rhinitis: Secondary | ICD-10-CM | POA: Diagnosis not present

## 2024-06-26 DIAGNOSIS — J3081 Allergic rhinitis due to animal (cat) (dog) hair and dander: Secondary | ICD-10-CM | POA: Diagnosis not present

## 2024-06-26 DIAGNOSIS — J3089 Other allergic rhinitis: Secondary | ICD-10-CM | POA: Diagnosis not present

## 2024-06-26 DIAGNOSIS — J301 Allergic rhinitis due to pollen: Secondary | ICD-10-CM | POA: Diagnosis not present

## 2024-07-03 DIAGNOSIS — J3081 Allergic rhinitis due to animal (cat) (dog) hair and dander: Secondary | ICD-10-CM | POA: Diagnosis not present

## 2024-07-03 DIAGNOSIS — J301 Allergic rhinitis due to pollen: Secondary | ICD-10-CM | POA: Diagnosis not present

## 2024-07-03 DIAGNOSIS — J3089 Other allergic rhinitis: Secondary | ICD-10-CM | POA: Diagnosis not present

## 2024-07-24 DIAGNOSIS — J301 Allergic rhinitis due to pollen: Secondary | ICD-10-CM | POA: Diagnosis not present

## 2024-07-24 DIAGNOSIS — J3081 Allergic rhinitis due to animal (cat) (dog) hair and dander: Secondary | ICD-10-CM | POA: Diagnosis not present

## 2024-07-24 DIAGNOSIS — J3089 Other allergic rhinitis: Secondary | ICD-10-CM | POA: Diagnosis not present

## 2024-07-27 NOTE — Progress Notes (Unsigned)
 [name] [address] [place] [address] Phone: [phone] Subjective:   Curtis Lewis, am serving as a scribe for Dr. [name].  I'm seeing this patient by the request  of:  Sun, [name], MD  CC: Right shoulder pain  [name]  05/24/2024 Rotator cuff appears to be significantly improved but unfortunately continues to have chronic subluxation.  Patient has full strength noted.  The subluxation is a bicep tendon issue.  I do believe that the retinaculum is gone.  Discussed with patient about icing regimen and home exercises, discussed which activities to do, discussed changing from a compression sleeve to more of a strap that could be beneficial.  Will follow-up again in 3 months to make sure doing well.  Worsening pain will need to consider the possibility of advanced imaging.      Update 08/01/2024 [name] Cinsere Mizrahi is a 53 y.o. male coming in with complaint of R RTC tear. Patient states that 3 weeks ago he was carrying a 60# box that he tried to grab quickly when it slipped out of his hands. Has some anterior shoulder pain.        Past Medical History:  Diagnosis Date   Hyperlipidemia    Lipoma    on back   Osteoarthritis of right knee    Past Surgical History:  Procedure Laterality Date   APPENDECTOMY     KNEE ARTHROSCOPY W/ ACL RECONSTRUCTION     x3 on right   KNEE BURSECTOMY     left   Social History   Socioeconomic History   Marital status: Married    Spouse name: Not on file   Number of children: Not on file   Years of education: Not on file   Highest education level: Not on file  Occupational History   Not on file  Tobacco Use   Smoking status: Never   Smokeless tobacco: Never  Substance and Sexual Activity   Alcohol use: Yes    Alcohol/week: 2.0 standard drinks of alcohol    Types: 2 Glasses of wine per week   Drug use: No   Sexual activity: Not on file  Other Topics Concern   Not on file  Social  History Narrative   Not on file   Social Drivers of Health   Financial Resource Strain: Not on file  Food Insecurity: Not on file  Transportation Needs: Not on file  Physical Activity: Not on file  Stress: Not on file  Social Connections: Not on file   Allergies  Allergen Reactions   Percocet [Oxycodone-Acetaminophen ] Itching and Nausea And Vomiting   Family History  Problem Relation Age of Onset   Cancer Maternal Grandmother        lung     Current Outpatient Medications (Cardiovascular):    atorvastatin (LIPITOR) 20 MG tablet, daily.   nitroGLYCERIN  (NITRO-DUR ) 0.2 mg/hr patch, Apply 1/4 of a patch to skin once daily.       Reviewed prior external information including notes and imaging from  primary care provider As well as notes that were available from care everywhere and other healthcare systems.  Past medical history, social, surgical and family history all reviewed in electronic medical record.  No pertanent information unless stated regarding to the chief complaint.   Review of Systems:  No headache, visual changes, nausea, vomiting, diarrhea, constipation, dizziness, abdominal pain, skin rash, fevers, chills, night sweats, weight loss, swollen lymph nodes, body aches, joint swelling, chest pain,  shortness of breath, mood changes. POSITIVE muscle aches  Objective  Blood pressure 108/74, pulse 73, height 5' 10 (1.778 m), weight 200 lb (90.7 kg), SpO2 94%.   General: No apparent distress alert and oriented x3 mood and affect normal, dressed appropriately.  HEENT: Pupils equal, extraocular movements intact  Respiratory: Patient's speak in full sentences and does not appear short of breath  Cardiovascular: No lower extremity edema, non tender, no erythema  Right shoulder exam shows positive O'Brien's noted, rotator cuff strength likely intact.  Good range of motion but does have some impingement with Neer and Hawkins.  Bicep tendon is tight compared to the  contralateral side but does have the postsurgical changes noted   Limited muscular skeletal ultrasound was performed and interpreted by CLAUDENE HUSSAR, M  Limited ultrasound still shows a chronic subluxation of the bicep tendon noted.  Mild hypoechoic changes noted.  Patient does have some potential scarring noted of the posterior labrum noted.  Rotator cuff appears intact without any significant findings at the moment.   Impression and Recommendations:    The above documentation has been reviewed and is accurate and complete Navia Lindahl M Cristabel Bicknell, DO

## 2024-08-01 ENCOUNTER — Encounter: Payer: Self-pay | Admitting: Family Medicine

## 2024-08-01 ENCOUNTER — Ambulatory Visit (INDEPENDENT_AMBULATORY_CARE_PROVIDER_SITE_OTHER): Admitting: Family Medicine

## 2024-08-01 ENCOUNTER — Other Ambulatory Visit: Payer: Self-pay

## 2024-08-01 VITALS — BP 108/74 | HR 73 | Ht 70.0 in | Wt 200.0 lb

## 2024-08-01 DIAGNOSIS — M25511 Pain in right shoulder: Secondary | ICD-10-CM | POA: Diagnosis not present

## 2024-08-01 DIAGNOSIS — S43001A Unspecified subluxation of right shoulder joint, initial encounter: Secondary | ICD-10-CM | POA: Insufficient documentation

## 2024-08-01 NOTE — Patient Instructions (Signed)
 Keep wearing brace Limit ROM Worsening pain consider injection See me in 3 months to make sure completely healed

## 2024-08-01 NOTE — Assessment & Plan Note (Addendum)
 Chronic overall, no significant at this time.  I do believe the patient does have probably a SLAP labral tear.  Will continue to monitor otherwise.  Discussed icing regimen and home exercises, discussed which activities to do in which ones to avoid.  Increase activity slowly.  Discussed icing regimen.  Follow-up again 3 months discussed if any worsening symptoms consider the MRI but patient wants to avoid that at the moment.

## 2024-08-30 DIAGNOSIS — J3081 Allergic rhinitis due to animal (cat) (dog) hair and dander: Secondary | ICD-10-CM | POA: Diagnosis not present

## 2024-08-30 DIAGNOSIS — J3089 Other allergic rhinitis: Secondary | ICD-10-CM | POA: Diagnosis not present

## 2024-08-30 DIAGNOSIS — J301 Allergic rhinitis due to pollen: Secondary | ICD-10-CM | POA: Diagnosis not present

## 2024-09-07 DIAGNOSIS — Z5181 Encounter for therapeutic drug level monitoring: Secondary | ICD-10-CM | POA: Diagnosis not present

## 2024-09-07 DIAGNOSIS — Z79899 Other long term (current) drug therapy: Secondary | ICD-10-CM | POA: Diagnosis not present

## 2024-09-07 DIAGNOSIS — J3081 Allergic rhinitis due to animal (cat) (dog) hair and dander: Secondary | ICD-10-CM | POA: Diagnosis not present

## 2024-09-29 DIAGNOSIS — J3081 Allergic rhinitis due to animal (cat) (dog) hair and dander: Secondary | ICD-10-CM | POA: Diagnosis not present

## 2024-09-29 DIAGNOSIS — J301 Allergic rhinitis due to pollen: Secondary | ICD-10-CM | POA: Diagnosis not present

## 2024-09-29 DIAGNOSIS — J3089 Other allergic rhinitis: Secondary | ICD-10-CM | POA: Diagnosis not present

## 2024-10-24 DIAGNOSIS — J3081 Allergic rhinitis due to animal (cat) (dog) hair and dander: Secondary | ICD-10-CM | POA: Diagnosis not present

## 2024-10-24 DIAGNOSIS — Z23 Encounter for immunization: Secondary | ICD-10-CM | POA: Diagnosis not present

## 2024-10-24 DIAGNOSIS — J301 Allergic rhinitis due to pollen: Secondary | ICD-10-CM | POA: Diagnosis not present

## 2024-10-24 DIAGNOSIS — J3089 Other allergic rhinitis: Secondary | ICD-10-CM | POA: Diagnosis not present

## 2024-10-30 NOTE — Progress Notes (Unsigned)
 [facility] [address] [place] [address] Phone: [phone] Subjective:   ISusannah Lewis, am serving as a scribe for Dr. [name].  I'm seeing this patient by the request  of:  Sun, [name], MD  CC: Right shoulder pain  [name]  08/01/2024 Chronic overall, no significant at this time.  I do believe the patient does have probably a SLAP labral tear.  Will continue to monitor otherwise.  Discussed icing regimen and home exercises, discussed which activities to do in which ones to avoid.  Increase activity slowly.  Discussed icing regimen.  Follow-up again 3 months discussed if any worsening symptoms consider the MRI but patient wants to avoid that at the moment.      Update 11/01/2024 Curtis Lewis is a 53 y.o. male coming in with complaint of R shoulder pain.  Found to have more of a subluxation of the tendon of the long head of the bicep with concern of a SLAP tear.  Patient states no pain. Low back and hamstring issues. Has had previous Si joint issues. Started up about 3 months ago. Pain/cramping radiating down to hamstrings.       Past Medical History:  Diagnosis Date   Hyperlipidemia    Lipoma    on back   Osteoarthritis of right knee    Past Surgical History:  Procedure Laterality Date   APPENDECTOMY     KNEE ARTHROSCOPY W/ ACL RECONSTRUCTION     x3 on right   KNEE BURSECTOMY     left   Social History   Socioeconomic History   Marital status: Married    Spouse name: Not on file   Number of children: Not on file   Years of education: Not on file   Highest education level: Not on file  Occupational History   Not on file  Tobacco Use   Smoking status: Never   Smokeless tobacco: Never  Substance and Sexual Activity   Alcohol use: Yes    Alcohol/week: 2.0 standard drinks of alcohol    Types: 2 Glasses of wine per week   Drug use: No   Sexual activity: Not on file  Other Topics Concern   Not on file   Social History Narrative   Not on file   Social Drivers of Health   Financial Resource Strain: Not on file  Food Insecurity: Not on file  Transportation Needs: Not on file  Physical Activity: Not on file  Stress: Not on file  Social Connections: Not on file   Allergies  Allergen Reactions   Percocet [Oxycodone-Acetaminophen ] Itching and Nausea And Vomiting   Family History  Problem Relation Age of Onset   Cancer Maternal Grandmother        lung     Current Outpatient Medications (Cardiovascular):    atorvastatin (LIPITOR) 20 MG tablet, daily.   nitroGLYCERIN  (NITRO-DUR ) 0.2 mg/hr patch, Apply 1/4 of a patch to skin once daily.     Current Outpatient Medications (Other):    gabapentin (NEURONTIN) 100 MG capsule, Take 2 capsules (200 mg total) by mouth at bedtime.    Reviewed prior external information including notes and imaging from  primary care provider As well as notes that were available from care everywhere and other healthcare systems.  Past medical history, social, surgical and family history all reviewed in electronic medical record.  No pertanent information unless stated regarding to the chief complaint.   Review of Systems:  No headache, visual  changes, nausea, vomiting, diarrhea, constipation, dizziness, abdominal pain, skin rash, fevers, chills, night sweats, weight loss, swollen lymph nodes, body aches, joint swelling, chest pain, shortness of breath, mood changes. POSITIVE muscle aches  Objective  Blood pressure 128/82, pulse 70, height 5' 10 (1.778 m), weight 208 lb (94.3 kg), SpO2 99%.   General: No apparent distress alert and oriented x3 mood and affect normal, dressed appropriately.  HEENT: Pupils equal, extraocular movements intact  Respiratory: Patient's speak in full sentences and does not appear short of breath  Cardiovascular: No lower extremity edema, non tender, no erythema  Shoulder exam shows great range of motion noted.  No  significant weakness noted.  No impingement noted today. Low back does have some loss of lordosis.  Worsening pain with extension noted.  Negative straight leg test but does have tightness Neurovascularly intact distally.  5 out of 5 strength the lower extremities at this time   02889; 15 additional minutes spent for Therapeutic exercises as stated in above notes.  This included exercises focusing on stretching, strengthening, with significant focus on eccentric aspects.   Long term goals include an improvement in range of motion, strength, endurance as well as avoiding reinjury. Patient's frequency would include in 1-2 times a day, 3-5 times a week for a duration of 6-12 weeks. Low back exercises that included:  Pelvic tilt/bracing instruction to focus on control of the pelvic girdle and lower abdominal muscles  Glute strengthening exercises, focusing on proper firing of the glutes without engaging the low back muscles Proper stretching techniques for maximum relief for the hamstrings, hip flexors, low back and some rotation where tolerated  Proper technique shown and discussed handout in great detail with ATC.  All questions were discussed and answered.      Impression and Recommendations:     The above documentation has been reviewed and is accurate and complete Meriah Shands M Taysen Bushart, DO

## 2024-11-01 ENCOUNTER — Ambulatory Visit (INDEPENDENT_AMBULATORY_CARE_PROVIDER_SITE_OTHER)

## 2024-11-01 ENCOUNTER — Ambulatory Visit (INDEPENDENT_AMBULATORY_CARE_PROVIDER_SITE_OTHER): Admitting: Family Medicine

## 2024-11-01 ENCOUNTER — Other Ambulatory Visit: Payer: Self-pay

## 2024-11-01 VITALS — BP 128/82 | HR 70 | Ht 70.0 in | Wt 208.0 lb

## 2024-11-01 DIAGNOSIS — M47816 Spondylosis without myelopathy or radiculopathy, lumbar region: Secondary | ICD-10-CM | POA: Diagnosis not present

## 2024-11-01 DIAGNOSIS — M545 Low back pain, unspecified: Secondary | ICD-10-CM | POA: Diagnosis not present

## 2024-11-01 DIAGNOSIS — M48062 Spinal stenosis, lumbar region with neurogenic claudication: Secondary | ICD-10-CM | POA: Diagnosis not present

## 2024-11-01 DIAGNOSIS — M5126 Other intervertebral disc displacement, lumbar region: Secondary | ICD-10-CM | POA: Diagnosis not present

## 2024-11-01 DIAGNOSIS — M25552 Pain in left hip: Secondary | ICD-10-CM | POA: Diagnosis not present

## 2024-11-01 DIAGNOSIS — M47817 Spondylosis without myelopathy or radiculopathy, lumbosacral region: Secondary | ICD-10-CM | POA: Diagnosis not present

## 2024-11-01 DIAGNOSIS — M438X5 Other specified deforming dorsopathies, thoracolumbar region: Secondary | ICD-10-CM | POA: Diagnosis not present

## 2024-11-01 DIAGNOSIS — M25511 Pain in right shoulder: Secondary | ICD-10-CM

## 2024-11-01 MED ORDER — GABAPENTIN 100 MG PO CAPS: 200.0000 mg | ORAL_CAPSULE | Freq: Every day | ORAL | 0 refills | Status: DC

## 2024-11-01 NOTE — Patient Instructions (Addendum)
 Xrays today. Spinal stenosis exercises. Gabapentin 200 mg at night PT with John at the [facility]. See me in 2 months.

## 2024-11-01 NOTE — Assessment & Plan Note (Signed)
 Patient is having signs and symptoms consistent with spinal stenosis with radicular symptoms left greater than right in more of the L5 and S1 distribution.  Will get x-rays to rule out any pars defect, restart physical therapy which patient has done in the past for sacroiliac joint.  Patient will get gabapentin 200 mg which I think will be beneficial as well.  Could be a candidate for potential osteopathic manipulation but at the moment secondary to the severity as well as the frequency feel to start with conservative therapy would be more beneficial.  Follow-up with me again in 6 to 12 weeks

## 2024-11-01 NOTE — Addendum Note (Signed)
 Addended by: TONNIE SHU D on: 11/01/2024 08:52 AM   Modules accepted: Orders

## 2024-11-08 ENCOUNTER — Ambulatory Visit: Payer: Self-pay | Admitting: Family Medicine

## 2024-11-16 DIAGNOSIS — M48062 Spinal stenosis, lumbar region with neurogenic claudication: Secondary | ICD-10-CM | POA: Diagnosis not present

## 2024-11-16 DIAGNOSIS — M25511 Pain in right shoulder: Secondary | ICD-10-CM | POA: Diagnosis not present

## 2024-11-27 DIAGNOSIS — M25511 Pain in right shoulder: Secondary | ICD-10-CM | POA: Diagnosis not present

## 2024-11-27 DIAGNOSIS — M48062 Spinal stenosis, lumbar region with neurogenic claudication: Secondary | ICD-10-CM | POA: Diagnosis not present

## 2024-11-30 DIAGNOSIS — J301 Allergic rhinitis due to pollen: Secondary | ICD-10-CM | POA: Diagnosis not present

## 2024-11-30 DIAGNOSIS — J3089 Other allergic rhinitis: Secondary | ICD-10-CM | POA: Diagnosis not present

## 2024-11-30 DIAGNOSIS — J3081 Allergic rhinitis due to animal (cat) (dog) hair and dander: Secondary | ICD-10-CM | POA: Diagnosis not present

## 2024-12-11 DIAGNOSIS — M25511 Pain in right shoulder: Secondary | ICD-10-CM | POA: Diagnosis not present

## 2024-12-11 DIAGNOSIS — M48062 Spinal stenosis, lumbar region with neurogenic claudication: Secondary | ICD-10-CM | POA: Diagnosis not present

## 2024-12-13 DIAGNOSIS — H5213 Myopia, bilateral: Secondary | ICD-10-CM | POA: Diagnosis not present

## 2024-12-13 DIAGNOSIS — H25013 Cortical age-related cataract, bilateral: Secondary | ICD-10-CM | POA: Diagnosis not present

## 2024-12-13 DIAGNOSIS — H16403 Unspecified corneal neovascularization, bilateral: Secondary | ICD-10-CM | POA: Diagnosis not present

## 2024-12-13 DIAGNOSIS — H524 Presbyopia: Secondary | ICD-10-CM | POA: Diagnosis not present

## 2025-01-03 NOTE — Progress Notes (Signed)
 " [facility] [address] [place] [address] Phone: [phone] Subjective:   LILLETTE Berwyn Posey, am serving as a scribe for Dr. [name].  I'm seeing this patient by the request  of:  Lewis, [name], MD  CC: Shoulder and back pain f/u  [name]  11/01/2024 Patient is having signs and symptoms consistent with spinal stenosis with radicular symptoms left greater than right in more of the L5 and S1 distribution.  Will get x-rays to rule out any pars defect, restart physical therapy which patient has done in the past for sacroiliac joint.  Patient will get gabapentin  200 mg which I think will be beneficial as well.  Could be a candidate for potential osteopathic manipulation but at the moment secondary to the severity as well as the frequency feel to start with conservative therapy would be more beneficial.  Follow-up with me again in 6 to 12 weeks     Update 01/06/2024 Curtis Lewis is a 54 y.o. male coming in with complaint of R shoulder pain. Patient states that his shoulder is doing better. Wearing chopat strap for bicep tendon.   Lower back pain has persisted. Has been doing PT since last appt. Pain is staying the same thus far. Gabapentin  has been helpful to decrease intensity of pain especially in the mornings.   Patient does have spinal stenosis.  Started gabapentin  at 200 mg.  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    Past Medical History:  Diagnosis Date   Hyperlipidemia    Lipoma    on back   Osteoarthritis of right knee    Past Surgical History:  Procedure Laterality Date   APPENDECTOMY     KNEE ARTHROSCOPY W/ ACL RECONSTRUCTION     x3 on right   KNEE BURSECTOMY     left   Social History   Socioeconomic History   Marital status: Married    Spouse name: Not on file   Number of children: Not  on file   Years of education: Not on file   Highest education level: Not on file  Occupational History   Not on file  Tobacco Use   Smoking status: Never   Smokeless tobacco: Never  Substance and Sexual Activity   Alcohol use: Yes    Alcohol/week: 2.0 standard drinks of alcohol    Types: 2 Glasses of wine per week   Drug use: No   Sexual activity: Not on file  Other Topics Concern   Not on file  Social History Narrative   Not on file   Social Drivers of Health   Tobacco Use: Low Risk (08/01/2024)   Patient History    Smoking Tobacco Use: Never    Smokeless Tobacco Use: Never    Passive Exposure: Not on file  Financial Resource Strain: Not on file  Food Insecurity: Not on file  Transportation Needs: Not on file  Physical Activity: Not on file  Stress: Not on file  Social Connections: Not on file  Depression (EYV7-0): Not on file  Alcohol Screen: Not on file  Housing: Not on file  Utilities: Not on file  Health Literacy: Not on file   Allergies[1] Family History  Problem Relation Age of Onset   Cancer Maternal Grandmother        lung    Current Outpatient Medications (Cardiovascular):  atorvastatin (LIPITOR) 20 MG tablet, daily.   nitroGLYCERIN  (NITRO-DUR ) 0.2 mg/hr patch, Apply 1/4 of a patch to skin once daily.  Current Outpatient Medications (Other):    gabapentin  (NEURONTIN ) 100 MG capsule, Take 2 capsules (200 mg total) by mouth at bedtime.   Reviewed prior external information including notes and imaging from  primary care provider As well as notes that were available from care everywhere and other healthcare systems.  Past medical history, social, surgical and family history all reviewed in electronic medical record.  No pertanent information unless stated regarding to the chief complaint.   Review of Systems:  No headache, visual changes, nausea, vomiting, diarrhea, constipation, dizziness, abdominal pain, skin rash, fevers, chills, night sweats,  weight loss, swollen lymph nodes, body aches, joint swelling, chest pain, shortness of breath, mood changes. POSITIVE muscle aches  Objective  There were no vitals taken for this visit.   General: No apparent distress alert and oriented x3 mood and affect normal, dressed appropriately.  HEENT: Pupils equal, extraocular movements intact  Respiratory: Patient's speak in full sentences and does not appear short of breath  Cardiovascular: No lower extremity edema, non tender, no erythema  Shoulder exam shows  Good range motion at the moment. Back exam shows  Patient does have significant loss of lordosis noted.  Difficulty with straight leg test secondary to tightness in the hamstring.  Patient only has 5 degrees of extension of the back noted.  Worsening pain with any extension.   Impression and Recommendations:    The above documentation has been reviewed and is accurate and complete [name], DO        [1]  Allergies Allergen Reactions   Percocet [Oxycodone-Acetaminophen ] Itching and Nausea And Vomiting   "

## 2025-01-05 ENCOUNTER — Other Ambulatory Visit: Payer: Self-pay

## 2025-01-05 ENCOUNTER — Ambulatory Visit: Admitting: Family Medicine

## 2025-01-05 VITALS — BP 108/72 | HR 91 | Ht 70.0 in | Wt 209.0 lb

## 2025-01-05 DIAGNOSIS — M25511 Pain in right shoulder: Secondary | ICD-10-CM | POA: Diagnosis not present

## 2025-01-05 DIAGNOSIS — M48062 Spinal stenosis, lumbar region with neurogenic claudication: Secondary | ICD-10-CM | POA: Diagnosis not present

## 2025-01-05 DIAGNOSIS — M545 Low back pain, unspecified: Secondary | ICD-10-CM | POA: Diagnosis not present

## 2025-01-05 MED ORDER — GABAPENTIN 300 MG PO CAPS: 300.0000 mg | ORAL_CAPSULE | Freq: Every day | ORAL | 0 refills | Status: AC

## 2025-01-05 NOTE — Assessment & Plan Note (Signed)
 Patient has been some improvement with the gabapentin  but unfortunately continues to have pain on a daily basis and is affecting his daily activities as well as his livelihood.  Patient has failed formal physical therapy, anti-inflammatories, x-rays do show the patient does have fairly significant degenerative disc disease at multiple levels.  Follow-up with me again after advanced imaging.  Could be a candidate for potential injections and epidurals.  Follow-up again 6 to 12 weeks otherwise.

## 2025-01-05 NOTE — Patient Instructions (Signed)
 Gabapentin  300mg   MRI lumbar spine See me in 2-3 months

## 2025-01-08 ENCOUNTER — Ambulatory Visit (INDEPENDENT_AMBULATORY_CARE_PROVIDER_SITE_OTHER)

## 2025-01-08 DIAGNOSIS — M545 Low back pain, unspecified: Secondary | ICD-10-CM

## 2025-01-08 DIAGNOSIS — M5442 Lumbago with sciatica, left side: Secondary | ICD-10-CM

## 2025-01-08 DIAGNOSIS — M5441 Lumbago with sciatica, right side: Secondary | ICD-10-CM

## 2025-01-09 ENCOUNTER — Ambulatory Visit: Payer: Self-pay | Admitting: Family Medicine

## 2025-01-09 ENCOUNTER — Other Ambulatory Visit: Payer: Self-pay

## 2025-01-09 DIAGNOSIS — M5416 Radiculopathy, lumbar region: Secondary | ICD-10-CM

## 2025-01-12 ENCOUNTER — Ambulatory Visit
Admission: RE | Admit: 2025-01-12 | Discharge: 2025-01-12 | Disposition: A | Discharge status: Home / Self Care | Source: Ambulatory Visit | Attending: Family Medicine | Admitting: Family Medicine

## 2025-01-12 DIAGNOSIS — M5416 Radiculopathy, lumbar region: Secondary | ICD-10-CM

## 2025-01-12 MED ORDER — IOPAMIDOL (ISOVUE-M 200) INJECTION 41%
1.0000 mL | Freq: Once | INTRAMUSCULAR | Status: AC
  Administered 2025-01-12: 1 mL via EPIDURAL

## 2025-01-12 MED ORDER — METHYLPREDNISOLONE ACETATE 40 MG/ML INJ SUSP (RADIOLOG
80.0000 mg | Freq: Once | INTRAMUSCULAR | Status: AC
  Administered 2025-01-12: 80 mg via EPIDURAL

## 2025-01-12 NOTE — Discharge Instructions (Signed)

## 2025-03-07 ENCOUNTER — Ambulatory Visit: Admitting: Family Medicine
